# Patient Record
Sex: Female | Born: 1965 | Race: White | Hispanic: No | Marital: Single | Smoking: Current every day smoker
Health system: Southern US, Community
[De-identification: ages and names within clinical notes are randomized; demographics above are authoritative.]

## PROBLEM LIST (undated history)

## (undated) DIAGNOSIS — N289 Disorder of kidney and ureter, unspecified: Secondary | ICD-10-CM

## (undated) DIAGNOSIS — I10 Essential (primary) hypertension: Secondary | ICD-10-CM

## (undated) DIAGNOSIS — F4310 Post-traumatic stress disorder, unspecified: Secondary | ICD-10-CM

## (undated) DIAGNOSIS — R42 Dizziness and giddiness: Secondary | ICD-10-CM

---

## 2009-12-21 ENCOUNTER — Ambulatory Visit: Payer: Self-pay | Admitting: Diagnostic Radiology

## 2009-12-21 ENCOUNTER — Emergency Department (HOSPITAL_BASED_OUTPATIENT_CLINIC_OR_DEPARTMENT_OTHER): Admission: EM | Admit: 2009-12-21 | Discharge: 2009-12-21 | Payer: Self-pay | Admitting: Emergency Medicine

## 2010-06-04 LAB — GC/CHLAMYDIA PROBE AMP, GENITAL
Chlamydia, DNA Probe: NEGATIVE
GC Probe Amp, Genital: NEGATIVE

## 2010-06-04 LAB — DIFFERENTIAL
Basophils Absolute: 0.4 10*3/uL — ABNORMAL HIGH (ref 0.0–0.1)
Lymphs Abs: 1.1 10*3/uL (ref 0.7–4.0)
Monocytes Absolute: 1.3 10*3/uL — ABNORMAL HIGH (ref 0.1–1.0)
Monocytes Relative: 6 % (ref 3–12)
Neutrophils Relative %: 87 % — ABNORMAL HIGH (ref 43–77)

## 2010-06-04 LAB — BASIC METABOLIC PANEL
BUN: 16 mg/dL (ref 6–23)
Calcium: 10.6 mg/dL — ABNORMAL HIGH (ref 8.4–10.5)
Chloride: 95 mEq/L — ABNORMAL LOW (ref 96–112)
Creatinine, Ser: 1 mg/dL (ref 0.4–1.2)
GFR calc Af Amer: >60 mL/min (ref >60)

## 2010-06-04 LAB — CBC
MCV: 88.6 fL (ref 78.0–100.0)
Platelets: 289 10*3/uL (ref 150–400)
RBC: 4.98 MIL/uL (ref 3.87–5.11)
RDW: 12.7 % (ref 11.5–15.5)
WBC: 21.2 10*3/uL — ABNORMAL HIGH (ref 4.0–10.5)

## 2010-06-04 LAB — WET PREP, GENITAL
Trich, Wet Prep: NONE SEEN
Yeast Wet Prep HPF POC: NONE SEEN

## 2010-06-04 LAB — URINALYSIS, ROUTINE W REFLEX MICROSCOPIC
Ketones, ur: >80 mg/dL — AB
Nitrite: NEGATIVE
Urobilinogen, UA: 1 mg/dL (ref 0.0–1.0)

## 2010-06-04 LAB — URINE CULTURE

## 2010-06-04 LAB — PREGNANCY, URINE: Preg Test, Ur: NEGATIVE

## 2016-02-02 ENCOUNTER — Emergency Department (HOSPITAL_COMMUNITY)
Admission: EM | Admit: 2016-02-02 | Discharge: 2016-02-02 | Disposition: A | Discharge status: Home / Self Care | Payer: Self-pay | Attending: Emergency Medicine | Admitting: Emergency Medicine

## 2016-02-02 ENCOUNTER — Emergency Department (HOSPITAL_COMMUNITY): Payer: Self-pay

## 2016-02-02 ENCOUNTER — Encounter (HOSPITAL_COMMUNITY): Payer: Self-pay | Admitting: *Deleted

## 2016-02-02 DIAGNOSIS — Z760 Encounter for issue of repeat prescription: Secondary | ICD-10-CM | POA: Insufficient documentation

## 2016-02-02 DIAGNOSIS — R42 Dizziness and giddiness: Secondary | ICD-10-CM | POA: Insufficient documentation

## 2016-02-02 DIAGNOSIS — R002 Palpitations: Secondary | ICD-10-CM | POA: Insufficient documentation

## 2016-02-02 DIAGNOSIS — I10 Essential (primary) hypertension: Secondary | ICD-10-CM | POA: Insufficient documentation

## 2016-02-02 DIAGNOSIS — F17200 Nicotine dependence, unspecified, uncomplicated: Secondary | ICD-10-CM | POA: Insufficient documentation

## 2016-02-02 HISTORY — DX: Disorder of kidney and ureter, unspecified: N28.9

## 2016-02-02 HISTORY — DX: Dizziness and giddiness: R42

## 2016-02-02 HISTORY — DX: Essential (primary) hypertension: I10

## 2016-02-02 LAB — CBC
HEMATOCRIT: 43 % (ref 36.0–46.0)
HEMOGLOBIN: 15.2 g/dL — AB (ref 12.0–15.0)
MCH: 31.6 pg (ref 26.0–34.0)
MCHC: 35.3 g/dL (ref 30.0–36.0)
MCV: 89.4 fL (ref 78.0–100.0)
PLATELETS: 340 10*3/uL (ref 150–400)
RBC: 4.81 MIL/uL (ref 3.87–5.11)
RDW: 12.9 % (ref 11.5–15.5)
WBC: 9.4 10*3/uL (ref 4.0–10.5)

## 2016-02-02 LAB — URINALYSIS, ROUTINE W REFLEX MICROSCOPIC
BILIRUBIN URINE: NEGATIVE
Glucose, UA: NEGATIVE mg/dL
Hgb urine dipstick: NEGATIVE
Ketones, ur: NEGATIVE mg/dL
Leukocytes, UA: NEGATIVE
NITRITE: NEGATIVE
Protein, ur: NEGATIVE mg/dL
SPECIFIC GRAVITY, URINE: 1.007 (ref 1.005–1.030)
pH: 6 (ref 5.0–8.0)

## 2016-02-02 LAB — BASIC METABOLIC PANEL
Anion gap: 12 (ref 5–15)
BUN: 18 mg/dL (ref 6–20)
CHLORIDE: 104 mmol/L (ref 101–111)
CO2: 23 mmol/L (ref 22–32)
Calcium: 10 mg/dL (ref 8.9–10.3)
Creatinine, Ser: 0.86 mg/dL (ref 0.44–1.00)
GFR calc non Af Amer: >60 mL/min (ref >60)
Glucose, Bld: 100 mg/dL — ABNORMAL HIGH (ref 65–99)
POTASSIUM: 4.3 mmol/L (ref 3.5–5.1)
SODIUM: 139 mmol/L (ref 135–145)

## 2016-02-02 LAB — I-STAT TROPONIN, ED: Troponin i, poc: 0 ng/mL (ref 0.00–0.08)

## 2016-02-02 MED ORDER — ATENOLOL 25 MG PO TABS: 25.0000 mg | ORAL_TABLET | Freq: Every day | ORAL | 0 refills | Status: DC

## 2016-02-02 MED ORDER — MECLIZINE HCL 25 MG PO TABS
25.0000 mg | ORAL_TABLET | Freq: Once | ORAL | Status: AC
  Administered 2016-02-02: 25 mg via ORAL
  Filled 2016-02-02: qty 1

## 2016-02-02 MED ORDER — MECLIZINE HCL 25 MG PO TABS: 25.0000 mg | ORAL_TABLET | Freq: Two times a day (BID) | ORAL | 0 refills | Status: DC | PRN

## 2016-02-02 NOTE — ED Provider Notes (Signed)
[facility]-EMERGENCY DEPT Provider Note   CSN: [id number] Arrival date & time: 02/02/16  1129     History   Chief Complaint Chief Complaint  Patient presents with  . Chest Pain  . Medication Refill    HPI Jasmin Berry is a 50 y.o. female.  HPI Jasmin Berry is a 50 y.o. female with PMH significant for HTN, renal disorder, and vertigo who presents with 4 day history of palpitations with associated chest pain described as "heart pounding", shortness of breath, and states she can feel her heart beat in her ears and a hum throughout her whole body.  No syncope or near syncope.  She also states she has been having vertigo for the past several months.  She ran out of her BP medications last week and tried to follow up with her PCP, but the would not refill her atenolol prescription any longer.  She states since she ran out she has been taking her friend's unknown BP medications.  No head injury/trauma.  Patient's mood is labile during history and she sporadically begins crying and becoming very tearful and repetitively saying "i'm sorry, i'm sorry, i'm sorry" and will then speak about finding [employer] and she used to be [employer].  Per chart review, patient called her PCP today. She had missed her previous follow up appointment, and told her she needed to be seen in the office for her refill, but she declined.   Past Medical History:  Diagnosis Date  . Hypertension   . Renal disorder   . Vertigo     There are no active problems to display for this patient.   History reviewed. No pertinent surgical history.  OB History    No data available       Home Medications    Prior to Admission medications   Medication Sig Start Date End Date Taking? Authorizing Provider  atenolol (TENORMIN) 25 MG tablet Take 1 tablet (25 mg total) by mouth daily. 02/02/16   Cheri FowlerKayla Kynli Chou, PA-C  meclizine (ANTIVERT) 25 MG tablet Take 1 tablet (25 mg total) by mouth 2 (two) times daily as needed for dizziness.  02/02/16   Cheri FowlerKayla Nithya Meriweather, PA-C    Family History No family history on file.  Social History Social History  Substance Use Topics  . Smoking status: Current Every Day Smoker  . Smokeless tobacco: Never Used  . Alcohol use No     Allergies   Sulfa antibiotics   Review of Systems Review of Systems All other systems negative unless otherwise stated in HPI   Physical Exam Updated Vital Signs BP 118/91 (BP Location: Left Arm)   Pulse 98   Temp 98.1 F (36.7 C) (Oral)   Resp 16   Wt 43.1 kg   LMP  (LMP Unknown)   SpO2 100%   Physical Exam  Constitutional: She is oriented to person, place, and time. She appears well-developed and well-nourished.  Non-toxic appearance. She does not have a sickly appearance. She does not appear ill.  HENT:  Head: Normocephalic and atraumatic.  Mouth/Throat: Oropharynx is clear and moist.  Eyes: Conjunctivae are normal.  Neck: Normal range of motion. Neck supple.  Cardiovascular: Normal rate and regular rhythm.   Pulmonary/Chest: Effort normal and breath sounds normal. No accessory muscle usage or stridor. No respiratory distress. She has no wheezes. She has no rhonchi. She has no rales.  Abdominal: Soft. Bowel sounds are normal. She exhibits no distension. There is no tenderness.  Musculoskeletal: Normal range of motion.  Lymphadenopathy:    She has no cervical adenopathy.  Neurological: She is alert and oriented to person, place, and time.  Mental Status:   AOx3.  Speech clear without dysarthria. Cranial Nerves:  I-not tested  II-PERRLA  III, IV, VI-EOMs intact  V-temporal and masseter strength intact  VII-symmetrical facial movements intact, no facial droop  VIII-hearing grossly intact bilaterally  IX, X-gag intact  XI-strength of sternomastoid and trapezius muscles 5/5  XII-tongue midline Motor:   Good muscle bulk and tone  Strength 5/5 bilaterally in upper and lower extremities   Cerebellar--intact RAMs, finger to nose intact  bilaterally.  Gait normal  No pronator drift Sensory:  Intact in upper and lower extremities   Skin: Skin is warm and dry.  Psychiatric: She has a normal mood and affect. Her behavior is normal.     ED Treatments / Results  Labs (all labs ordered are listed, but only abnormal results are displayed) Labs Reviewed  BASIC METABOLIC PANEL - Abnormal; Notable for the following:       Result Value   Glucose, Bld 100 (*)    All other components within normal limits  CBC - Abnormal; Notable for the following:    Hemoglobin 15.2 (*)    All other components within normal limits  URINALYSIS, ROUTINE W REFLEX MICROSCOPIC (NOT AT [facility])  I-STAT TROPOININ, ED    EKG  EKG Interpretation None      ED ECG REPORT   Date: 02/02/2016  Rate: 119  Rhythm: sinus tachycardia with irregular rate  QRS Axis: normal  Intervals: normal  ST/T Wave abnormalities: borderline early repolarization  Conduction Disutrbances:none  Narrative Interpretation:   Old EKG Reviewed: none available  I have personally reviewed the EKG tracing and agree with the computerized printout as noted.   Radiology Dg Chest 2 View  Result Date: 02/02/2016 CLINICAL DATA:  Chest pain and nausea. EXAM: CHEST  2 VIEW COMPARISON:  None. FINDINGS: The cardiomediastinal silhouette is within normal limits. The lungs are well inflated and clear. There is no evidence of pleural effusion or pneumothorax. No acute osseous abnormality is identified. IMPRESSION: No active cardiopulmonary disease. Electronically Signed   By: Sebastian AcheAllen  Grady M.D.   On: 02/02/2016 12:31    Procedures Procedures (including critical care time)  Medications Ordered in ED Medications  meclizine (ANTIVERT) tablet 25 mg (25 mg Oral Given 02/02/16 1428)     Initial Impression / Assessment and Plan / ED Course  I have reviewed the triage vital signs and the nursing notes.  Pertinent labs & imaging results that were available during my care of the  patient were reviewed by me and considered in my medical decision making (see chart for details).  Clinical Course    Patient presents for medication refill.  She also presents with palpitations with chest pain x 4 days.  It has been constant.  She has been taking unknown blood pressure pills since she ran out.  Her mood is labile throughout H&P, laughing a joking and then suddenly tearful and crying.  She has a normal neurological exam.  Hx of vertigo and states this is not new.  She is concerned about her atenolol refill today.  Heart sounds normal.  Labs without acute abnormalities.  Single troponin sufficient given constant pain x 4 days.  Vitals and EKG reassuring.  Low suspicion for acute neurologic or cardiac cause at this time.  Will refill atenolol.  Urged follow up with PCP.  Warned patient of dangers and possible  death of taking unknown blood pressure medications. Return precautions discussed.  Stable for discharge.   Case has been discussed with Dr. Madilyn Hook who agrees with the above plan for discharge.   Final Clinical Impressions(s) / ED Diagnoses   Final diagnoses:  Palpitations  Medication refill  Vertigo    New Prescriptions Discharge Medication List as of 02/02/2016  2:43 PM    START taking these medications   Details  meclizine (ANTIVERT) 25 MG tablet Take 1 tablet (25 mg total) by mouth 2 (two) times daily as needed for dizziness., Starting Mon 02/02/2016, Print         Cheri Fowler, PA-C 02/02/16 2024    Tilden Fossa, MD 02/04/16 1534

## 2016-02-02 NOTE — Discharge Instructions (Signed)
Do not take unknown blood pressure medications as this can result in severe side effects and even death. Do not take her atenolol today. Start taking this tomorrow. [facility] for further medication refills. Return to the emergency department for severe chest pain, shortness of breath, passing out, or any new or concerning symptoms.

## 2016-02-02 NOTE — ED Notes (Signed)
Prior to medication administration pt request to void. Pt ambulated independently with stand by assist to nearby restroom.

## 2016-02-02 NOTE — ED Triage Notes (Signed)
Pt complains of chest pain, nausea since running out of her BP medication 1 week ago. Pt states [facility] and will no longer prescribe her BP medication for an unknown reason.. Pt states she ran out of BP medication. Pt states she has dizziness, can hear her heart beat in her ear. Pt states she tried calling her PCP. Pt takes atenolol.

## 2017-06-29 ENCOUNTER — Emergency Department (HOSPITAL_COMMUNITY)
Admission: EM | Admit: 2017-06-29 | Discharge: 2017-06-29 | Disposition: A | Discharge status: Home / Self Care | Payer: Self-pay | Attending: Emergency Medicine | Admitting: Emergency Medicine

## 2017-06-29 ENCOUNTER — Emergency Department (HOSPITAL_COMMUNITY): Payer: Self-pay

## 2017-06-29 ENCOUNTER — Encounter (HOSPITAL_COMMUNITY): Payer: Self-pay | Admitting: Emergency Medicine

## 2017-06-29 DIAGNOSIS — F17210 Nicotine dependence, cigarettes, uncomplicated: Secondary | ICD-10-CM | POA: Insufficient documentation

## 2017-06-29 DIAGNOSIS — Z79899 Other long term (current) drug therapy: Secondary | ICD-10-CM | POA: Insufficient documentation

## 2017-06-29 DIAGNOSIS — R51 Headache: Secondary | ICD-10-CM | POA: Insufficient documentation

## 2017-06-29 DIAGNOSIS — R202 Paresthesia of skin: Secondary | ICD-10-CM | POA: Insufficient documentation

## 2017-06-29 DIAGNOSIS — R42 Dizziness and giddiness: Secondary | ICD-10-CM | POA: Insufficient documentation

## 2017-06-29 DIAGNOSIS — R4781 Slurred speech: Secondary | ICD-10-CM | POA: Insufficient documentation

## 2017-06-29 DIAGNOSIS — I10 Essential (primary) hypertension: Secondary | ICD-10-CM | POA: Insufficient documentation

## 2017-06-29 DIAGNOSIS — R410 Disorientation, unspecified: Secondary | ICD-10-CM | POA: Insufficient documentation

## 2017-06-29 LAB — URINALYSIS, ROUTINE W REFLEX MICROSCOPIC
BACTERIA UA: NONE SEEN
BILIRUBIN URINE: NEGATIVE
GLUCOSE, UA: NEGATIVE mg/dL
HGB URINE DIPSTICK: NEGATIVE
KETONES UR: 5 mg/dL — AB
LEUKOCYTES UA: NEGATIVE
Nitrite: NEGATIVE
PROTEIN: 30 mg/dL — AB
Specific Gravity, Urine: 1.021 (ref 1.005–1.030)
pH: 6 (ref 5.0–8.0)

## 2017-06-29 LAB — BASIC METABOLIC PANEL
Anion gap: 11 (ref 5–15)
BUN: 22 mg/dL — ABNORMAL HIGH (ref 6–20)
CHLORIDE: 102 mmol/L (ref 101–111)
CO2: 27 mmol/L (ref 22–32)
CREATININE: 0.99 mg/dL (ref 0.44–1.00)
Calcium: 9.8 mg/dL (ref 8.9–10.3)
GFR calc non Af Amer: >60 mL/min (ref >60)
Glucose, Bld: 156 mg/dL — ABNORMAL HIGH (ref 65–99)
POTASSIUM: 4.2 mmol/L (ref 3.5–5.1)
SODIUM: 140 mmol/L (ref 135–145)

## 2017-06-29 LAB — CBC
HEMATOCRIT: 45.3 % (ref 36.0–46.0)
Hemoglobin: 15.3 g/dL — ABNORMAL HIGH (ref 12.0–15.0)
MCH: 31.2 pg (ref 26.0–34.0)
MCHC: 33.8 g/dL (ref 30.0–36.0)
MCV: 92.4 fL (ref 78.0–100.0)
PLATELETS: 367 10*3/uL (ref 150–400)
RBC: 4.9 MIL/uL (ref 3.87–5.11)
RDW: 13 % (ref 11.5–15.5)
WBC: 11.6 10*3/uL — AB (ref 4.0–10.5)

## 2017-06-29 LAB — I-STAT BETA HCG BLOOD, ED (MC, WL, AP ONLY)

## 2017-06-29 MED ORDER — MECLIZINE HCL 25 MG PO TABS: 25.0000 mg | ORAL_TABLET | Freq: Three times a day (TID) | ORAL | 0 refills | Status: DC | PRN

## 2017-06-29 MED ORDER — ATENOLOL 25 MG PO TABS: 25.0000 mg | ORAL_TABLET | Freq: Every day | ORAL | 0 refills | Status: DC

## 2017-06-29 MED ORDER — MECLIZINE HCL 25 MG PO TABS
25.0000 mg | ORAL_TABLET | Freq: Once | ORAL | Status: AC
  Administered 2017-06-29: 25 mg via ORAL
  Filled 2017-06-29: qty 1

## 2017-06-29 MED ORDER — SODIUM CHLORIDE 0.9 % IV BOLUS: 1000.0000 mL | Freq: Once | INTRAVENOUS | Status: DC

## 2017-06-29 NOTE — ED Notes (Signed)
Patient states heavy PTSD and Anxiety history. Attempted IV but patient had a panic attack. Patient refusing bolus and IV. EDPA made aware.

## 2017-06-29 NOTE — ED Provider Notes (Signed)
[facility]-EMERGENCY DEPT Provider Note   CSN: [id number] Arrival date & time: 06/29/17  1724     History   Chief Complaint Chief Complaint  Patient presents with  . Dizziness  . Headache    HPI Jasmin Berry is a 52 y.o. female who presents with dizziness.  Past medical history significant for hypertension, renal disorder, vertigo.  She states she has had vertigo for years however in the past 2 months it has been more intense in nature.  She states she has not told anyone because she did not want to bring attention to it.  She has taken meclizine in the past which provided temporary relief however she cannot afford this medication.  She has also been off of her blood pressure medicine because she does not have insurance and cannot follow-up with a primary doctor.  Today she went about her usual business and went to a friend's apartment.  She had a sudden onset of confusion, slurred speech, tingling of her left forearm and left leg.  She felt like there was a "penny, metal, and peppermint taste" in her mouth.  She denies loss of consciousness during this episode.  The episode lasted approximately 15 minutes.  She decided to come to the emergency department to get checked out.  HPI  Past Medical History:  Diagnosis Date  . Hypertension   . Renal disorder   . Vertigo     There are no active problems to display for this patient.   History reviewed. No pertinent surgical history.   OB History   None      Home Medications    Prior to Admission medications   Medication Sig Start Date End Date Taking? Authorizing Provider  atenolol (TENORMIN) 25 MG tablet Take 1 tablet (25 mg total) by mouth daily. 02/02/16   Cheri Fowler, PA-C  meclizine (ANTIVERT) 25 MG tablet Take 1 tablet (25 mg total) by mouth 2 (two) times daily as needed for dizziness. 02/02/16   Cheri Fowler, PA-C    Family History No family history on file.  Social History Social History    Tobacco Use  . Smoking status: Current Every Day Smoker    Types: Cigarettes  . Smokeless tobacco: Never Used  Substance Use Topics  . Alcohol use: No  . Drug use: Not on file     Allergies   Sulfa antibiotics and Yeast-related products   Review of Systems Review of Systems  Constitutional: Negative for fever.  Respiratory: Negative for shortness of breath.   Cardiovascular: Positive for palpitations. Negative for chest pain.  Gastrointestinal: Negative for abdominal pain, nausea and vomiting.  Genitourinary: Negative for dysuria.  Neurological: Positive for dizziness and headaches. Negative for syncope and weakness.     Physical Exam Updated Vital Signs BP (!) 144/95 (BP Location: Left Arm)   Pulse (!) 120   Temp 99 F (37.2 C) (Oral)   Resp 19   SpO2 99%   Physical Exam  Constitutional: She is oriented to person, place, and time. She appears well-developed and well-nourished. No distress.  Cooperative, bright affect  HENT:  Head: Normocephalic and atraumatic.  Eyes: Pupils are equal, round, and reactive to light. Conjunctivae are normal. Right eye exhibits no discharge. Left eye exhibits no discharge. No scleral icterus.  Neck: Normal range of motion.  Cardiovascular: Normal rate and regular rhythm.  Pulmonary/Chest: Effort normal and breath sounds normal. No respiratory distress.  Abdominal: Soft. Bowel sounds are normal. She exhibits no distension. There  is no tenderness.  Neurological: She is alert and oriented to person, place, and time.  Mental Status:  Alert, oriented, thought content appropriate, able to give a coherent history. Speech fluent without evidence of aphasia. Able to follow 2 step commands without difficulty.  Cranial Nerves:  II:  Peripheral visual fields grossly normal, pupils equal, round, reactive to light III,IV, VI: ptosis not present, extra-ocular motions intact bilaterally. Reported dizziness with EOM V,VII: smile symmetric, facial  light touch sensation equal VIII: hearing grossly normal to voice  X: uvula elevates symmetrically  XI: bilateral shoulder shrug symmetric and strong XII: midline tongue extension without fassiculations Motor:  Normal tone. 5/5 in upper and lower extremities bilaterally including strong and equal grip strength and dorsiflexion/plantar flexion Sensory: Pinprick and light touch normal in all extremities.  Cerebellar: normal finger-to-nose with bilateral upper extremities Gait: normal gait and balance CV: distal pulses palpable throughout    Skin: Skin is warm and dry.  Psychiatric: She has a normal mood and affect. Her behavior is normal.  Nursing note and vitals reviewed.    ED Treatments / Results  Labs (all labs ordered are listed, but only abnormal results are displayed) Labs Reviewed  BASIC METABOLIC PANEL - Abnormal; Notable for the following components:      Result Value   Glucose, Bld 156 (*)    BUN 22 (*)    All other components within normal limits  CBC - Abnormal; Notable for the following components:   WBC 11.6 (*)    Hemoglobin 15.3 (*)    All other components within normal limits  URINALYSIS, ROUTINE W REFLEX MICROSCOPIC - Abnormal; Notable for the following components:   APPearance HAZY (*)    Ketones, ur 5 (*)    Protein, ur 30 (*)    Squamous Epithelial / LPF 0-5 (*)    All other components within normal limits  CBG MONITORING, ED  I-STAT BETA HCG BLOOD, ED ([facility], [facility], [facility] ONLY)    EKG EKG Interpretation  Date/Time:  Wednesday June 29 2017 17:39:56 EDT Ventricular Rate:  110 PR Interval:    QRS Duration: 66 QT Interval:  326 QTC Calculation: 441 R Axis:   80 Text Interpretation:  Sinus tachycardia Biatrial enlargement No significant change since last tracing Confirmed by Linwood DibblesKnapp, Jon [phone]) on 06/29/2017 5:48:01 PM   Radiology Ct Head Wo Contrast  Result Date: 06/29/2017 CLINICAL DATA:  Headache with disorientation. EXAM: CT HEAD WITHOUT CONTRAST  TECHNIQUE: Contiguous axial images were obtained from the base of the skull through the vertex without intravenous contrast. COMPARISON:  None. FINDINGS: BRAIN: The ventricles and sulci are normal. No intraparenchymal hemorrhage, mass effect nor midline shift. No acute large vascular territory infarcts. Grey-white matter distinction is maintained. The basal ganglia are unremarkable. No abnormal extra-axial fluid collections. Basal cisterns are not effaced and midline. The brainstem and cerebellar hemispheres are without acute abnormalities. VASCULAR: Mild atherosclerosis of the carotid siphons. SKULL/SOFT TISSUES: No skull fracture. No significant soft tissue swelling. ORBITS/SINUSES: The included ocular globes and orbital contents are normal.The mastoid air cells are clear. The included paranasal sinuses are well-aerated. OTHER: None. IMPRESSION: Normal head CT Electronically Signed   By: Tollie Ethavid  Kwon M.D.   On: 06/29/2017 19:21    Procedures Procedures (including critical care time)  Medications Ordered in ED Medications - No data to display   Initial Impression / Assessment and Plan / ED Course  I have reviewed the triage vital signs and the nursing notes.  Pertinent labs &  imaging results that were available during my care of the patient were reviewed by me and considered in my medical decision making (see chart for details).  52 year old female presents with intermittent dizziness and episode of confusion and headache earlier today.  In triage she is tachycardic and hypertensive.  This is improved after recheck. Her neurologic exam is unremarkable.  She reports dizziness with head movements and eye movements.  Will obtain labs and CT head.  Will give dose of meclizine.  CBC is remarkable for mild leukocytosis of 11.6.  BMP is remarkable for mild hyperglycemia.  Her urine has 5 ketones and 30 protein.  Fluid was ordered however she could not tolerate an IV.  She was encouraged to orally hydrate.   CT of the head was negative.  All results were discussed with the patient.  She was given a prescription for meclizine and and atenolol and coupons so she can afford these medicines.  She was also given [place] [facility] follow-up.  Final Clinical Impressions(s) / ED Diagnoses   Final diagnoses:  Dizziness    ED Discharge Orders    None       Beryle Quant 06/29/17 2129    Linwood Dibbles, MD 06/29/17 2348

## 2017-06-29 NOTE — Discharge Instructions (Signed)
Take Meclizine up to three times daily for dizziness Take Atenolol daily [facility] Return if worsening

## 2017-06-29 NOTE — ED Notes (Signed)
EDPA Provider at bedside. 

## 2017-06-29 NOTE — ED Triage Notes (Signed)
Pt reports that she was at her friends house and all of sudden got disoriented and headache, felt clammy, dry mouth. Reports that she hasnt had her HTN medications in years and only has one kidney.

## 2017-08-23 ENCOUNTER — Emergency Department (HOSPITAL_COMMUNITY)
Admission: EM | Admit: 2017-08-23 | Discharge: 2017-08-23 | Disposition: A | Discharge status: Home / Self Care | Payer: Self-pay | Attending: Emergency Medicine | Admitting: Emergency Medicine

## 2017-08-23 ENCOUNTER — Encounter (HOSPITAL_COMMUNITY): Payer: Self-pay | Admitting: Emergency Medicine

## 2017-08-23 ENCOUNTER — Other Ambulatory Visit: Payer: Self-pay

## 2017-08-23 DIAGNOSIS — F17210 Nicotine dependence, cigarettes, uncomplicated: Secondary | ICD-10-CM | POA: Insufficient documentation

## 2017-08-23 DIAGNOSIS — R002 Palpitations: Secondary | ICD-10-CM | POA: Insufficient documentation

## 2017-08-23 DIAGNOSIS — Z79899 Other long term (current) drug therapy: Secondary | ICD-10-CM | POA: Insufficient documentation

## 2017-08-23 DIAGNOSIS — I10 Essential (primary) hypertension: Secondary | ICD-10-CM | POA: Insufficient documentation

## 2017-08-23 DIAGNOSIS — Z3202 Encounter for pregnancy test, result negative: Secondary | ICD-10-CM | POA: Insufficient documentation

## 2017-08-23 DIAGNOSIS — R42 Dizziness and giddiness: Secondary | ICD-10-CM | POA: Insufficient documentation

## 2017-08-23 LAB — CBC
HEMATOCRIT: 41.8 % (ref 36.0–46.0)
Hemoglobin: 14.3 g/dL (ref 12.0–15.0)
MCH: 31.2 pg (ref 26.0–34.0)
MCHC: 34.2 g/dL (ref 30.0–36.0)
MCV: 91.3 fL (ref 78.0–100.0)
Platelets: 298 10*3/uL (ref 150–400)
RBC: 4.58 MIL/uL (ref 3.87–5.11)
RDW: 12.6 % (ref 11.5–15.5)
WBC: 7 10*3/uL (ref 4.0–10.5)

## 2017-08-23 LAB — BASIC METABOLIC PANEL
ANION GAP: 9 (ref 5–15)
BUN: 22 mg/dL — AB (ref 6–20)
CHLORIDE: 104 mmol/L (ref 101–111)
CO2: 27 mmol/L (ref 22–32)
Calcium: 9.4 mg/dL (ref 8.9–10.3)
Creatinine, Ser: 0.74 mg/dL (ref 0.44–1.00)
Glucose, Bld: 99 mg/dL (ref 65–99)
Potassium: 4 mmol/L (ref 3.5–5.1)
SODIUM: 140 mmol/L (ref 135–145)

## 2017-08-23 LAB — URINALYSIS, ROUTINE W REFLEX MICROSCOPIC
Bilirubin Urine: NEGATIVE
Glucose, UA: NEGATIVE mg/dL
Hgb urine dipstick: NEGATIVE
KETONES UR: NEGATIVE mg/dL
LEUKOCYTES UA: NEGATIVE
NITRITE: NEGATIVE
PROTEIN: NEGATIVE mg/dL
Specific Gravity, Urine: 1.01 (ref 1.005–1.030)
pH: 6 (ref 5.0–8.0)

## 2017-08-23 LAB — POC URINE PREG, ED: PREG TEST UR: NEGATIVE

## 2017-08-23 LAB — CBG MONITORING, ED: Glucose-Capillary: 88 mg/dL (ref 65–99)

## 2017-08-23 MED ORDER — ATENOLOL 25 MG PO TABS: 25.0000 mg | ORAL_TABLET | Freq: Every day | ORAL | 0 refills | Status: DC

## 2017-08-23 MED ORDER — CETIRIZINE HCL 10 MG PO TABS: 10.0000 mg | ORAL_TABLET | Freq: Every day | ORAL | 0 refills | Status: DC

## 2017-08-23 MED ORDER — FLUTICASONE PROPIONATE 50 MCG/ACT NA SUSP: 1.0000 | Freq: Every day | NASAL | 2 refills | Status: DC

## 2017-08-23 MED ORDER — NAPROXEN 375 MG PO TABS: 375.0000 mg | ORAL_TABLET | Freq: Two times a day (BID) | ORAL | 0 refills | Status: DC

## 2017-08-23 NOTE — ED Triage Notes (Signed)
Pt verbalizes palpitations with running out of blood pressure medication yesterday. Pt continues to verbalizes continued dizziness post diagnosis of vertigo and "inner ear and sinuses."

## 2017-08-23 NOTE — Discharge Instructions (Signed)
Please see the information and instructions below regarding your visit.  Your diagnoses today include:  1. Palpitations   2. Dizziness    Your work-up is very reassuring today.  Your blood counts and kidney function are normal today.  Tests performed today include: See side panel of your discharge paperwork for testing performed today. Vital signs are listed at the bottom of these instructions.   Your EKG shows that you have a premature beat, but no other abnormalities.  Medications prescribed:    Take any prescribed medications only as prescribed, and any over the counter medications only as directed on the packaging.  Please initiate therapy for allergic rhinitis with Zyrtec and Flonase.  You will spray Flonase 1 spray in each nostril once daily in the morning.  Home care instructions:  Please follow any educational materials contained in this packet.   For the jaw joint, please place warm compresses on these joints multiple times a day when you are having temple pain.  Follow-up instructions: Please follow-up with your primary care provider listed in this paperwork for further evaluation of your symptoms if they are not completely improved.   Return instructions:  Please return to the Emergency Department if you experience worsening symptoms.  Please return to the emergency department if you develop any chest pain, shortness of breath, weakness or numbness in extremities, or sudden passing out spells.  Please return if you have any other emergent concerns.  Additional Information:   Your vital signs today were: BP (!) 119/100 (BP Location: Left Arm)    Pulse (!) 102    Temp 98.3 F (36.8 C) (Oral)    Resp 16    SpO2 100%  If your blood pressure (BP) was elevated on multiple readings during this visit above 130 for the top number or above 80 for the bottom number, please have this repeated by your primary care provider within one month. --------------  Thank you for  allowing us to participate in your care today.

## 2017-08-23 NOTE — Care Management Note (Signed)
Case Management Note  CM consulted for no pcp and no ins with pt's desire to establish a pcp.  Information placed on AVS for [facility], financial counseling, and pharmacy.  Elnoria HowardUpdated Murray, PA.  No further CM needs noted at this time.

## 2017-08-23 NOTE — ED Provider Notes (Signed)
[facility]-EMERGENCY DEPT Provider Note   CSN: [id number] Arrival date & time: 08/23/17  1329     History   Chief Complaint Chief Complaint  Patient presents with  . Palpitations  . Dizziness    HPI Jasmin Berry is a 52 y.o. female.  HPI  Patient is a 52 year old female with a history of hypertension, scarring of the left kidney, and dizziness previously diagnosis vertigo presenting for palpitations, recurrent dizziness.  Patient reports that all of the symptoms she is experienced for "years", however she ran out of her atenolol yesterday, and she reports that this typically calls her symptoms.  Patient reports that her "dizzy spells" her previous diagnosis vertigo, and she has sensation of the room is spinning, particularly with certain eye or head movements.  Patient describes this for 5 years.  Patient reports she does not experience tinnitus, but does note that she feels a "popping" in her ears particularly with opening her mouth widely.  Patient also reports that she will experience some nausea without vomiting when she has a severe episode of vertigo.  Patient denies any fevers, chills, recent respiratory illnesses, visual disturbance, weakness or numbness in extremities, difficulty walking, chest pain, shortness of breath.   Past Medical History:  Diagnosis Date  . Hypertension   . Renal disorder   . Vertigo     There are no active problems to display for this patient.   History reviewed. No pertinent surgical history.   OB History   None      Home Medications    Prior to Admission medications   Medication Sig Start Date End Date Taking? Authorizing Provider  acetaminophen (TYLENOL) 500 MG tablet Take 1,000 mg by mouth every 6 (six) hours as needed.   Yes [provider]  atenolol (TENORMIN) 25 MG tablet Take 1 tablet (25 mg total) by mouth daily. 06/29/17  Yes Bethel BornGekas, Kelly Marie, PA-C  meclizine (ANTIVERT) 25 MG tablet Take 1 tablet  (25 mg total) by mouth 3 (three) times daily as needed for dizziness. 06/29/17  Yes Bethel BornGekas, Kelly Marie, PA-C  OVER THE COUNTER MEDICATION Take by mouth daily as needed. CBD Oil -use 1 dropperful orally repeatedly daily, patient says "a lot"   Yes [provider]  MAGNESIUM PO Take by mouth.    [provider]  Omega-3 Fatty Acids (FISH OIL PO) Take by mouth.    [provider]    Family History No family history on file.  Social History Social History   Tobacco Use  . Smoking status: Current Every Day Smoker    Types: Cigarettes  . Smokeless tobacco: Never Used  Substance Use Topics  . Alcohol use: No  . Drug use: Not on file     Allergies   Sulfa antibiotics and Yeast-related products   Review of Systems Review of Systems  Constitutional: Negative for chills and fever.  HENT: Negative for congestion and rhinorrhea.   Respiratory: Negative for cough, chest tightness and shortness of breath.   Cardiovascular: Negative for chest pain.  Gastrointestinal: Positive for nausea. Negative for abdominal pain and vomiting.  Musculoskeletal: Negative for arthralgias and myalgias.  Skin: Negative for rash.  Neurological: Positive for dizziness and light-headedness. Negative for speech difficulty, weakness and numbness.  All other systems reviewed and are negative.    Physical Exam Updated Vital Signs BP (!) 119/100 (BP Location: Left Arm)   Pulse (!) 102   Temp 98.3 F (36.8 C) (Oral)   Resp 16  SpO2 100%   Physical Exam  Constitutional: She appears well-developed and well-nourished. No distress.  HENT:  Head: Normocephalic and atraumatic.  Mouth/Throat: Oropharynx is clear and moist.  Bilateral TMs pearly gray appearance with good visualization of bony limits.   No effusions.  Discomfort to palpation of bilateral TMJs. Facet wear of teeth.  Eyes: Pupils are equal, round, and reactive to light. Conjunctivae and EOM are normal.  Neck: Normal  range of motion. Neck supple.  Cardiovascular: Normal rate, regular rhythm, S1 normal and S2 normal.  No murmur heard. Pulmonary/Chest: Effort normal and breath sounds normal. She has no wheezes. She has no rales.  Abdominal: Soft. She exhibits no distension.  Musculoskeletal: Normal range of motion. She exhibits no edema or deformity.  Neurological: She is alert.  Cranial nerves grossly intact. Patient moves extremities symmetrically and with good coordination.  Skin: Skin is warm and dry. No rash noted. No erythema.  Psychiatric: She has a normal mood and affect. Her behavior is normal. Judgment and thought content normal.  Nursing note and vitals reviewed.    ED Treatments / Results  Labs (all labs ordered are listed, but only abnormal results are displayed) Labs Reviewed  BASIC METABOLIC PANEL - Abnormal; Notable for the following components:      Result Value   BUN 22 (*)    All other components within normal limits  CBC  URINALYSIS, ROUTINE W REFLEX MICROSCOPIC  CBG MONITORING, ED  POC URINE PREG, ED    EKG EKG Interpretation  Date/Time:  Tuesday August 23 2017 13:45:03 EDT Ventricular Rate:  106 PR Interval:    QRS Duration: 57 QT Interval:  355 QTC Calculation: 472 R Axis:   82 Text Interpretation:  Sinus tachycardia Ventricular premature complex Right atrial enlargement Baseline wander in lead(s) V1 No significant change was found Confirmed by Azalia Bilis ([id number]) on 08/23/2017 2:48:53 PM   Radiology No results found.  Procedures Procedures (including critical care time)  Medications Ordered in ED Medications - No data to display   Initial Impression / Assessment and Plan / ED Course  I have reviewed the triage vital signs and the nursing notes.  Pertinent labs & imaging results that were available during my care of the patient were reviewed by me and considered in my medical decision making (see chart for details).  Clinical Course as of Aug 24 1542    Tue Aug 23, 2017  1537 Patient not tachycardic on my examination.   [AM]    Clinical Course User Index [AM] Elisha Ponder, PA-C    Patient nontoxic-appearing and in no acute distress.  EKG shows premature ventricular complex, but no other ectopy, arrhythmia, or signs of ischemia.  Lab work entirely normal today.  Patient not describing any acute symptoms today, but reports that she is unable to get into primary care provider.  Patient has had previous head imaging and a presentation of dizziness associated with other neurologic symptoms.  Do not feel that it would need to be repeated today, as patient has no other new neurologic symptoms.  Differential diagnosis for patient's symptoms in the head neck include benign positional vertigo, eustachian tube dysfunction, TMJ disorder.  We will treat patient with allergic rhinitis therapies, and give information for TMJ therapies, and refill Atenolol.  Case measurement consultation placed to assist patient getting a primary care as well as resources dispensed.  Patient return precautions for any chest pain, shortness of breath, syncope or presyncope with palpitations, or new worsening neurologic  symptoms.  Patient is in understanding and agrees the plan of care.  Final Clinical Impressions(s) / ED Diagnoses   Final diagnoses:  Palpitations  Dizziness    ED Discharge Orders        Ordered    atenolol (TENORMIN) 25 MG tablet  Daily     08/23/17 1553    cetirizine (ZYRTEC) 10 MG tablet  Daily     08/23/17 1553    fluticasone (FLONASE) 50 MCG/ACT nasal spray  Daily     08/23/17 1553    naproxen (NAPROSYN) 375 MG tablet  2 times daily     08/23/17 1553       Elisha Ponder, PA-C 08/23/17 1607    Pricilla Loveless, MD 08/24/17 [phone]

## 2018-01-14 ENCOUNTER — Emergency Department (HOSPITAL_COMMUNITY)
Admission: EM | Admit: 2018-01-14 | Discharge: 2018-01-14 | Disposition: A | Discharge status: Home / Self Care | Payer: Self-pay | Attending: Emergency Medicine | Admitting: Emergency Medicine

## 2018-01-14 ENCOUNTER — Other Ambulatory Visit: Payer: Self-pay

## 2018-01-14 ENCOUNTER — Encounter (HOSPITAL_COMMUNITY): Payer: Self-pay | Admitting: Emergency Medicine

## 2018-01-14 DIAGNOSIS — F419 Anxiety disorder, unspecified: Secondary | ICD-10-CM | POA: Insufficient documentation

## 2018-01-14 DIAGNOSIS — Z046 Encounter for general psychiatric examination, requested by authority: Secondary | ICD-10-CM | POA: Insufficient documentation

## 2018-01-14 DIAGNOSIS — F17210 Nicotine dependence, cigarettes, uncomplicated: Secondary | ICD-10-CM | POA: Insufficient documentation

## 2018-01-14 DIAGNOSIS — R21 Rash and other nonspecific skin eruption: Secondary | ICD-10-CM | POA: Insufficient documentation

## 2018-01-14 DIAGNOSIS — I10 Essential (primary) hypertension: Secondary | ICD-10-CM | POA: Insufficient documentation

## 2018-01-14 DIAGNOSIS — R451 Restlessness and agitation: Secondary | ICD-10-CM | POA: Insufficient documentation

## 2018-01-14 DIAGNOSIS — R490 Dysphonia: Secondary | ICD-10-CM | POA: Insufficient documentation

## 2018-01-14 DIAGNOSIS — Z79899 Other long term (current) drug therapy: Secondary | ICD-10-CM | POA: Insufficient documentation

## 2018-01-14 DIAGNOSIS — F329 Major depressive disorder, single episode, unspecified: Secondary | ICD-10-CM | POA: Insufficient documentation

## 2018-01-14 LAB — RAPID URINE DRUG SCREEN, HOSP PERFORMED
Amphetamines: NOT DETECTED
Barbiturates: NOT DETECTED
Benzodiazepines: NOT DETECTED
COCAINE: NOT DETECTED
OPIATES: NOT DETECTED
Tetrahydrocannabinol: POSITIVE — AB

## 2018-01-14 MED ORDER — TRIAMCINOLONE ACETONIDE 0.1 % EX CREA: 1.0000 "application " | TOPICAL_CREAM | Freq: Two times a day (BID) | CUTANEOUS | 0 refills | Status: DC

## 2018-01-14 MED ORDER — CEPHALEXIN 250 MG PO CAPS: 250.0000 mg | ORAL_CAPSULE | Freq: Two times a day (BID) | ORAL | 0 refills | Status: AC

## 2018-01-14 NOTE — Discharge Instructions (Signed)
Return to ED for worsening symptoms, lip swelling, trouble breathing or trouble swallowing, chest pain or shortness of breath.

## 2018-01-14 NOTE — ED Notes (Signed)
Pt requests to have her throat checked, due to hoarseness.

## 2018-01-14 NOTE — ED Provider Notes (Signed)
[facility]-EMERGENCY DEPT Provider Note   CSN: [id number] Arrival date & time: 01/14/18  0546     History   Chief Complaint Chief Complaint  Patient presents with  . Rash    HPI Jasmin Berry is a 52 y.o. female with past medical history of hypertension, vertigo who presents to ED for multiple complaints.  Her first complaint is a rash that she has had on her neck and her back intermittently for the past 3 months.  States that her rash flares up anytime she is stressed.  Patient appears to be a poor historian, has tangential speech.  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  She also states that she has had a flareup of her "yeast infection" on her skin.  States that "I cannot even eat yeast rolls without it flaring up." Denies any suicidal ideations but states that she did have a "mental breakdown" several weeks ago in which she vaguely states that she had "a bottle of laxatives, some other medicine and Clorox that I put under my bathroom sink."  Again, patient is difficult to understand.  States that she is a "suicide survivor" and that the suicide helpline has not been helping her.  She denies any homicidal ideations.  She does feel like there are bugs crawling in her rash.  Denies any auditory or visual hallucinations.  HPI  Past Medical History:  Diagnosis Date  . Hypertension   . Renal disorder   . Vertigo     There are no active problems to display for this patient.   History reviewed. No pertinent surgical history.   OB History   None      Home Medications    Prior to Admission medications   Medication Sig Start Date End Date Taking? Authorizing Provider  MAGNESIUM PO Take by mouth.   Yes [provider]  OVER THE COUNTER MEDICATION Take by mouth daily as needed. CBD Oil -use 1 dropperful orally repeatedly daily, patient says "a lot"   Yes [provider]  atenolol (TENORMIN) 25 MG tablet Take 1 tablet (25 mg total) by mouth daily. 08/23/17   Aviva Kluver B, PA-C  cephALEXin (KEFLEX) 250 MG capsule Take 1 capsule (250 mg total) by mouth 2 (two) times daily for 7 days. 01/14/18 01/21/18  Dearius Hoffmann, PA-C  cetirizine (ZYRTEC) 10 MG tablet Take 1 tablet (10 mg total) by mouth daily. 08/23/17 09/22/17  Aviva Kluver B, PA-C  fluticasone (FLONASE) 50 MCG/ACT nasal spray Place 1 spray into both nostrils daily. 08/23/17   Aviva Kluver B, PA-C  meclizine (ANTIVERT) 25 MG tablet Take 1 tablet (25 mg total) by mouth 3 (three) times daily as needed for dizziness. 06/29/17   Bethel Born, PA-C  naproxen (NAPROSYN) 375 MG tablet Take 1 tablet (375 mg total) by mouth 2 (two) times daily. 08/23/17   Aviva Kluver B, PA-C  triamcinolone cream (KENALOG) 0.1 % Apply 1 application topically 2 (two) times daily. 01/14/18   Dietrich Pates, PA-C    Family History No family history on file.  Social History Social History   Tobacco Use  . Smoking status: Current Every Day Smoker    Types: Cigarettes  . Smokeless tobacco: Never  Used  Substance Use Topics  . Alcohol use: No  . Drug use: Not on file     Allergies   Sulfa antibiotics and Yeast-related products   Review of Systems Review of Systems  Constitutional: Negative for appetite change, chills and fever.  HENT: Negative for ear pain, rhinorrhea, sneezing and sore throat.   Eyes: Negative for photophobia and visual disturbance.  Respiratory: Negative for cough, chest tightness, shortness of breath and wheezing.   Cardiovascular: Negative for chest pain and palpitations.  Gastrointestinal: Negative for abdominal pain, blood in stool, constipation, diarrhea, nausea and vomiting.  Genitourinary: Negative for dysuria, hematuria  and urgency.  Musculoskeletal: Negative for myalgias.  Skin: Positive for rash.  Neurological: Negative for dizziness, weakness and light-headedness.  Psychiatric/Behavioral: Positive for agitation. The patient is hyperactive.      Physical Exam Updated Vital Signs BP (!) 148/88   Pulse 90   Temp 97.9 F (36.6 C) (Oral)   Resp 20   SpO2 100%   Physical Exam  Constitutional: She appears well-developed and well-nourished. No distress.  HENT:  Head: Normocephalic and atraumatic.  Nose: Nose normal.  Eyes: Conjunctivae and EOM are normal. Left eye exhibits no discharge. No scleral icterus.  Neck: Normal range of motion. Neck supple.  Cardiovascular: Normal rate, regular rhythm, normal heart sounds and intact distal pulses. Exam reveals no gallop and no friction rub.  No murmur heard. Pulmonary/Chest: Effort normal and breath sounds normal. No respiratory distress.  Abdominal: Soft. Bowel sounds are normal. She exhibits no distension. There is no tenderness. There is no guarding.  Musculoskeletal: Normal range of motion. She exhibits no edema.  Neurological: She is alert. She exhibits normal muscle tone. Coordination normal.  Skin: Skin is warm and dry. Rash noted.  Several erythematous, scabbed lesions noted to posterior neck, satellite lesions going down back.  Psychiatric: Her mood appears anxious. Her speech is tangential. She is agitated. She exhibits a depressed mood.  Nursing note and vitals reviewed.    ED Treatments / Results  Labs (all labs ordered are listed, but only abnormal results are displayed) Labs Reviewed  RAPID URINE DRUG SCREEN, HOSP PERFORMED - Abnormal; Notable for the following components:      Result Value   Tetrahydrocannabinol POSITIVE (*)    All other components within normal limits    EKG None  Radiology No results found.  Procedures Procedures (including critical care time)  Medications Ordered in ED Medications - No data to  display   Initial Impression / Assessment and Plan / ED Course  I have reviewed the triage vital signs and the nursing notes.  Pertinent labs & imaging results that were available during my care of the patient were reviewed by me and considered in my medical decision making (see chart for details).     52 year old female presents to ED for rash.  States that she has had intermittent rash similar to this in the past several months secondary to stress.  States that she is stressed from her job and home life.  Patient with tangential speech, many emotions during my encounter.  She denies any suicidal or homicidal ideations however, she did want to speak to a behavioral health specialist. Pt has a patent airway without stridor and is handling secretions without difficulty; no angioedema. No blisters, no pustules, no warmth, no draining sinus tracts, no superficial abscesses, no bullous impetigo, no vesicles, no desquamation, no target lesions with dusky purpura or a central bulla. Not tender to touch. No  concern for superimposed infection. No concern for SJS, TEN, TSS, tick borne illness, syphilis or other life-threatening condition.  Suspect that her symptoms are due to her stress.  States that Keflex and triamcinolone have helped in the past.  Patient is psychiatrically cleared by TTS.  Will advise her to return to ED for any severe worsening symptoms.  Portions of this note were generated with Scientist, clinical (histocompatibility and immunogenetics). Dictation errors may occur despite best attempts at proofreading.  Final Clinical Impressions(s) / ED Diagnoses   Final diagnoses:  Rash and nonspecific skin eruption    ED Discharge Orders         Ordered    triamcinolone cream (KENALOG) 0.1 %  2 times daily     01/14/18 0958    cephALEXin (KEFLEX) 250 MG capsule  2 times daily     01/14/18 0958           Dietrich Pates, PA-C 01/14/18 7846    Doug Sou, MD 01/14/18 [phone]

## 2018-01-14 NOTE — ED Triage Notes (Addendum)
Pt comes in complaining of wounds that have popped up on her neck and back. Patient states she thinks they are stress related.  Spots are scabbed and red. Patient states steroid cream worked last time but she is out of it. Patient is stressed, anxious and angry at her visitor and states that is why the spots have popped up.

## 2018-01-14 NOTE — ED Notes (Signed)
Bed: WA05 Expected date:  Expected time:  Means of arrival:  Comments: 

## 2018-01-14 NOTE — BH Assessment (Signed)
Assessment Note  Jasmin Berry is an 52 y.o. female that presents this date with several somatic complaints. Patient denies any S/I, H/I or AVH. Patient denies any prior history of self harm or SA issues. Patient is declining to answer questions associated with assessment stating, "I am not here for any crazy issues." Information to complete assessment was obtained from notes. Per notes, patient has a past medical history of hypertension, vertigo who presents to ED for multiple complaints. Her first complaint is a rash that she has had on her neck and her back intermittently for the past 3 months. States that her rash flares up anytime she is stressed. Patient appears to be a poor historian, has tangential speech. 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  She also states that she has had a flare up of her "yeast infection" on her skin. States that "I cannot even eat yeast rolls without it flaring up." Denies any suicidal ideations but states that she did have a "mental breakdown" several weeks ago in which she vaguely states that she had "a bottle of laxatives, some other medicine and Clorox that I put under my bathroom sink." Case was staffed with Jannifer Franklin MD, Cresenciano Genre who recommended patient be discharged. Patient is declining any OP resources on discharge.       Diagnosis: Deferred  Past Medical History:  Past Medical History:  Diagnosis Date  . Hypertension   . Renal disorder   . Vertigo     History reviewed. No pertinent surgical history.  Family History: No family history on file.  Social History:  reports that she has been smoking cigarettes. She has never used smokeless tobacco. She reports that she does not drink alcohol. Her drug history is not  on file.  Additional Social History:  Alcohol / Drug Use Pain Medications: See MAR Prescriptions: See MAR Over the Counter: See MAR History of alcohol / drug use?: No history of alcohol / drug abuse Longest period of sobriety (when/how long): NA Negative Consequences of Use: (NA) Withdrawal Symptoms: (NA)  CIWA: CIWA-Ar BP: (!) 148/88 Pulse Rate: 90 COWS:    Allergies:  Allergies  Allergen Reactions  . Sulfa Antibiotics Rash  . Yeast-Related Products Rash    rash    Home Medications:  (Not in a hospital admission)  OB/GYN Status:  No LMP recorded. Patient is perimenopausal.  General Assessment Data Location of Assessment: [facility] ED TTS Assessment: In system Is this a Tele or Face-to-Face Assessment?: Face-to-Face Is this an Initial Assessment or a Re-assessment for this encounter?: Initial Assessment Patient Accompanied by:: (NA) Language Other than English: No Living Arrangements: (Alone) What gender do you identify as?: Female Marital status: Single Maiden name: Dukes Pregnancy Status: No Living Arrangements: Alone Can pt return to current living arrangement?: Yes Admission Status: Voluntary Is patient capable of signing voluntary admission?: Yes Referral Source: Self/Family/Friend Insurance type: Self pay  Medical Screening Exam [facility] Walk-in ONLY) Medical Exam completed: Yes  Crisis Care Plan Living Arrangements: Alone Legal Guardian: (NA) Name of Psychiatrist: None Name of Therapist: None  Education Status Is patient currently in school?: No Is the patient employed, unemployed or receiving disability?: Employed  Risk to self with the past 6 months Suicidal Ideation: No  Has patient been a risk to self within the past 6 months prior to admission? : No Suicidal Intent: No Has patient had any suicidal intent within the past 6 months prior to admission? : No Is patient at risk for suicide?: No, but patient needs Medical Clearance Suicidal Plan?: No Has  patient had any suicidal plan within the past 6 months prior to admission? : No Access to Means: No What has been your use of drugs/alcohol within the last 12 months?: NA Previous Attempts/Gestures: No How many times?: 0 Other Self Harm Risks: (NA) Triggers for Past Attempts: Unknown Intentional Self Injurious Behavior: None Family Suicide History: No Recent stressful life event(s): (Pt declines to answer) Persecutory voices/beliefs?: No Depression: No Depression Symptoms: (Denies) Substance abuse history and/or treatment for substance abuse?: No Suicide prevention information given to non-admitted patients: Not applicable  Risk to Others within the past 6 months Homicidal Ideation: No Does patient have any lifetime risk of violence toward others beyond the six months prior to admission? : No Thoughts of Harm to Others: No Current Homicidal Intent: No Current Homicidal Plan: No Access to Homicidal Means: No Identified Victim: NA History of harm to others?: No Assessment of Violence: None Noted Violent Behavior Description: NA Does patient have access to weapons?: No Criminal Charges Pending?: No Does patient have a court date: No Is patient on probation?: No  Psychosis Hallucinations: None noted Delusions: None noted  Mental Status Report Appearance/Hygiene: Unremarkable Eye Contact: Fair Motor Activity: Freedom of movement Speech: Logical/coherent Level of Consciousness: Alert Mood: Pleasant Affect: Appropriate to circumstance Anxiety Level: Minimal Thought Processes: Coherent, Relevant Judgement: Partial Orientation: Person, Place, Time Obsessive Compulsive Thoughts/Behaviors: None  Cognitive Functioning Concentration: Normal Memory: Recent Intact, Remote Intact Is patient IDD: No Insight: Fair Impulse Control: Fair Appetite: Good Have you had any weight changes? : No Change Sleep: No Change Total Hours of Sleep: 7 Vegetative Symptoms:  None  ADLScreening [facility] Assessment Services) Patient's cognitive ability adequate to safely complete daily activities?: Yes Patient able to express need for assistance with ADLs?: Yes Independently performs ADLs?: Yes (appropriate for developmental age)  Prior Inpatient Therapy Prior Inpatient Therapy: No  Prior Outpatient Therapy Prior Outpatient Therapy: No Does patient have an ACCT team?: No Does patient have Intensive In-House Services?  : No Does patient have [facility] services? : No Does patient have P4CC services?: No  ADL Screening (condition at time of admission) Patient's cognitive ability adequate to safely complete daily activities?: Yes Is the patient deaf or have difficulty hearing?: No Does the patient have difficulty seeing, even when wearing glasses/contacts?: No Does the patient have difficulty concentrating, remembering, or making decisions?: No Patient able to express need for assistance with ADLs?: Yes Does the patient have difficulty dressing or bathing?: No Independently performs ADLs?: Yes (appropriate for developmental age) Does the patient have difficulty walking or climbing stairs?: No Weakness of Legs: None Weakness of Arms/Hands: None  Home Assistive Devices/Equipment Home Assistive Devices/Equipment: None  Therapy Consults (therapy consults require a physician order) PT Evaluation Needed: No OT Evalulation Needed: No SLP Evaluation Needed: No Abuse/Neglect Assessment (Assessment to be complete while patient is alone) Physical Abuse: Denies Verbal Abuse: Denies Sexual Abuse: Denies Exploitation of patient/patient's resources: Denies Self-Neglect: Denies Values / Beliefs Cultural Requests During Hospitalization: None Spiritual Requests During Hospitalization: None Consults Spiritual Care Consult Needed: No Social Work Consult Needed: No Merchant [employer] officer (For Healthcare) Does Patient Have a Medical Advance Directive?: No Would patient like  information on creating a  medical advance directive?: No - Patient declined          Disposition:  Disposition Initial Assessment Completed for this Encounter: Yes Disposition of Patient: Discharge Patient refused recommended treatment: No Mode of transportation if patient is discharged?: Car Patient referred to: Outpatient clinic referral  On Site Evaluation by:   Reviewed with Physician:    Alfredia Ferguson 01/14/2018 11:14 AM

## 2018-01-14 NOTE — ED Notes (Signed)
Bed: WA04 Expected date:  Expected time:  Means of arrival:  Comments: 

## 2018-03-08 ENCOUNTER — Other Ambulatory Visit: Payer: Self-pay

## 2018-03-08 ENCOUNTER — Emergency Department (HOSPITAL_COMMUNITY)
Admission: EM | Admit: 2018-03-08 | Discharge: 2018-03-08 | Disposition: A | Discharge status: Home / Self Care | Payer: Self-pay | Attending: Emergency Medicine | Admitting: Emergency Medicine

## 2018-03-08 ENCOUNTER — Encounter (HOSPITAL_COMMUNITY): Payer: Self-pay

## 2018-03-08 ENCOUNTER — Emergency Department (HOSPITAL_COMMUNITY): Payer: Self-pay

## 2018-03-08 DIAGNOSIS — Z79899 Other long term (current) drug therapy: Secondary | ICD-10-CM | POA: Insufficient documentation

## 2018-03-08 DIAGNOSIS — R030 Elevated blood-pressure reading, without diagnosis of hypertension: Secondary | ICD-10-CM

## 2018-03-08 DIAGNOSIS — R21 Rash and other nonspecific skin eruption: Secondary | ICD-10-CM | POA: Insufficient documentation

## 2018-03-08 DIAGNOSIS — R05 Cough: Secondary | ICD-10-CM | POA: Insufficient documentation

## 2018-03-08 DIAGNOSIS — I10 Essential (primary) hypertension: Secondary | ICD-10-CM | POA: Insufficient documentation

## 2018-03-08 DIAGNOSIS — R059 Cough, unspecified: Secondary | ICD-10-CM

## 2018-03-08 DIAGNOSIS — F17210 Nicotine dependence, cigarettes, uncomplicated: Secondary | ICD-10-CM | POA: Insufficient documentation

## 2018-03-08 NOTE — ED Provider Notes (Signed)
[facility]-EMERGENCY DEPT Provider Note   CSN: [id number] Arrival date & time: 03/08/18  1226     History   Chief Complaint Chief Complaint  Patient presents with  . Rash    HPI Jasmin Berry is a 52 y.o. female presenting today for concern of rash that has been present for approximately 2-3 months.  Patient with multiple ED evaluations for this rash, initially treated with Keflex and Kenalog cream without improvement.  Patient reports that she was recently seen in [facility] for the same rash earlier this week and was prescribed doxycycline and discharged.  Patient reports taking 4 days worth of doxycycline 100 mg twice daily without improvement of her rash.  Patient states that her rash is all over her body, small red bumps that are not painful and not pruritic.  Denies discharge.  Of note patient reports that her friend at bedside was recently told that he had bedbugs in his house.  Patient denies seeing bedbugs on her person or in her home.  Additionally patient reports cough for the past 1 month, intermittent, productive with white sputum.  Patient states that occasionally when she has her cough she has "Jell-O legs"which causes her to be unsteady and sit on the ground.  She denies chest pain with these incidents.  According to triage note patient was lowered to ground in triage without injury after coughing spell, vital signs were stable, SPO2 98% on room air.  HPI  Past Medical History:  Diagnosis Date  . Hypertension   . Renal disorder   . Vertigo     There are no active problems to display for this patient.   History reviewed. No pertinent surgical history.   OB History   No obstetric history on file.      Home Medications    Prior to Admission medications   Medication Sig Start Date End Date Taking? Authorizing Provider  doxycycline (VIBRAMYCIN) 100 MG capsule Take 100 mg by mouth 2 (two) times daily. 03/02/18 03/12/18 Yes [provider]  ibuprofen (ADVIL,MOTRIN) 200 MG tablet Take 800 mg by mouth daily as needed for fever.   Yes [provider]  OVER THE COUNTER MEDICATION Take by mouth daily as needed. CBD Oil -use 1 dropperful orally repeatedly daily, patient says "a lot"   Yes [provider]  ranitidine (ZANTAC) 300 MG tablet Take 300 mg by mouth at bedtime.   Yes [provider]  atenolol (TENORMIN) 25 MG tablet Take 1 tablet (25 mg total) by mouth daily. Patient not taking: Reported on 03/08/2018 08/23/17   Aviva KluverMurray, Alyssa B, PA-C  cetirizine (ZYRTEC) 10 MG tablet Take 1 tablet (10 mg total) by mouth daily. Patient not taking: Reported on 03/08/2018 08/23/17 09/22/17  Aviva KluverMurray, Alyssa B, PA-C  fluticasone (FLONASE) 50 MCG/ACT nasal spray Place 1 spray into both nostrils daily. Patient not taking: Reported on 03/08/2018 08/23/17   Elisha PonderMurray, Alyssa B, PA-C  meclizine (ANTIVERT) 25 MG tablet Take 1 tablet (25 mg total) by mouth 3 (three) times daily as needed for dizziness. Patient not taking: Reported on 03/08/2018 06/29/17   Bethel BornGekas, Kelly Marie, PA-C  naproxen (NAPROSYN) 375 MG tablet Take 1 tablet (375 mg total) by mouth 2 (two) times daily. Patient not taking: Reported on 03/08/2018 08/23/17   Aviva KluverMurray, Alyssa B, PA-C  triamcinolone cream (KENALOG) 0.1 % Apply 1 application topically 2 (two) times daily. Patient not taking: Reported on 03/08/2018 01/14/18   Dietrich PatesKhatri, Hina, PA-C    Family  History No family history on file.  Social History Social History   Tobacco Use  . Smoking status: Current Every Day Smoker    Packs/day: 1.00    Types: Cigarettes  . Smokeless tobacco: Never Used  Substance Use Topics  . Alcohol use: No  . Drug use: Yes    Comment: CBD     Allergies   Sulfa antibiotics and Yeast-related products   Review of Systems Review of Systems  Constitutional: Negative.  Negative for chills and fever.  HENT: Negative.  Negative for rhinorrhea and sore throat.   Eyes:  Negative.  Negative for visual disturbance.  Respiratory: Positive for cough. Negative for shortness of breath.   Cardiovascular: Negative.  Negative for chest pain.  Gastrointestinal: Negative.  Negative for abdominal pain, blood in stool, diarrhea, nausea and vomiting.  Genitourinary: Negative.  Negative for dysuria and hematuria.  Musculoskeletal: Negative.  Negative for arthralgias and myalgias.  Skin: Positive for rash. Negative for wound.  Neurological: Negative.  Negative for dizziness, weakness and headaches.   Physical Exam Updated Vital Signs BP (!) 154/102 (BP Location: Right Arm)   Pulse (!) 102   Temp 98.6 F (37 C) (Oral)   Resp 18   Ht 4\' 10"  (1.473 m)   Wt 41.3 kg   SpO2 99%   BMI 19.02 kg/m   Physical Exam Constitutional:      General: She is not in acute distress.    Appearance: She is well-developed and normal weight. She is not ill-appearing or diaphoretic.  HENT:     Head: Normocephalic and atraumatic.     Right Ear: External ear normal.     Left Ear: External ear normal.     Nose: Nose normal.     Mouth/Throat:     Lips: Pink.     Mouth: Mucous membranes are moist.     Pharynx: Oropharynx is clear.     Comments: The patient has normal phonation and is in control of secretions. No stridor.  Midline uvula without edema. Soft palate rises symmetrically. No tonsillar erythema, swelling or exudates. Tongue protrusion is normal, floor of mouth is soft. No trismus. No creptius on neck palpation. No gingival erythema or fluctuance noted. Mucus membranes moist. Eyes:     General: Vision grossly intact. Gaze aligned appropriately.     Extraocular Movements: Extraocular movements intact.     Conjunctiva/sclera: Conjunctivae normal.     Pupils: Pupils are equal, round, and reactive to light.     Comments: Visual fields grossly intact bilaterally  Neck:     Musculoskeletal: Full passive range of motion without pain, normal range of motion and neck supple.      Trachea: Trachea normal. No tracheal deviation.  Cardiovascular:     Rate and Rhythm: Normal rate and regular rhythm.     Pulses: Normal pulses.          Dorsalis pedis pulses are 2+ on the right side and 2+ on the left side.       Posterior tibial pulses are 2+ on the right side and 2+ on the left side.     Heart sounds: Normal heart sounds.  Pulmonary:     Effort: Pulmonary effort is normal. No respiratory distress.     Breath sounds: Normal breath sounds and air entry.  Chest:     Chest wall: No deformity, tenderness or crepitus.  Abdominal:     General: Bowel sounds are normal.     Palpations: Abdomen is soft.  Tenderness: There is no abdominal tenderness. There is no guarding or rebound.  Musculoskeletal: Normal range of motion.     Right lower leg: Normal.     Left lower leg: Normal.  Feet:     Right foot:     Protective Sensation: 3 sites tested. 3 sites sensed.     Left foot:     Protective Sensation: 3 sites tested. 3 sites sensed.  Skin:    General: Skin is warm and dry.     Capillary Refill: Capillary refill takes less than 2 seconds.     Comments: Patient with sparse erythematous macules over extremities, low back and abdomen.  Neurological:     General: No focal deficit present.     Mental Status: She is alert.     GCS: GCS eye subscore is 4. GCS verbal subscore is 5. GCS motor subscore is 6.     Comments: Speech is clear and goal oriented, follows commands Major Cranial nerves without deficit, no facial droop Normal strength in upper and lower extremities bilaterally including dorsiflexion and plantar flexion, strong and equal grip strength Sensation normal to light touch Moves extremities without ataxia, coordination intact Normal gait  Psychiatric:        Speech: Speech normal.        Behavior: Behavior normal. Behavior is cooperative.    ED Treatments / Results  Labs (all labs ordered are listed, but only abnormal results are displayed) Labs Reviewed  - No data to display  EKG EKG Interpretation  Date/Time:  Wednesday March 08 2018 15:52:01 EST Ventricular Rate:  111 PR Interval:    QRS Duration: 77 QT Interval:  339 QTC Calculation: 461 R Axis:   72 Text Interpretation:  Sinus tachycardia Biatrial enlargement No significant change since last tracing Confirmed by Shaune Pollack [phone]) on 03/08/2018 4:00:48 PM   Radiology Dg Chest 2 View  Result Date: 03/08/2018 CLINICAL DATA:  Cough. EXAM: CHEST - 2 VIEW COMPARISON:  02/02/2016 FINDINGS: Both lungs are clear. Heart and mediastinum are within normal limits. Trachea is midline. Negative for a pneumothorax. No pleural effusions. Bony thorax is intact. IMPRESSION: No active cardiopulmonary disease. Electronically Signed   By: Richarda Overlie M.D.   On: 03/08/2018 18:07    Procedures Procedures (including critical care time)  Medications Ordered in ED Medications - No data to display   Initial Impression / Assessment and Plan / ED Course  I have reviewed the triage vital signs and the nursing notes.  Pertinent labs & imaging results that were available during my care of the patient were reviewed by me and considered in my medical decision making (see chart for details).    21:29 PM: 52 year old female presenting today for a 2-84-month history of rash that is unimproved after steroid and antibiotic patient's. History of bedbugs with friend at bedside. Additionally with cough for 1 month, possible brief weakness without vital sign abnormality in triage, patient states that this happens every time she has a severe coughing spell. Discussed with Dr. Erma Heritage, agrees with CXR and EKG. ------------------------------ EKG without acute changes reviewed by Dr. Erma Heritage Chest x-ray negative  Rash consistent with insect bites possibly bedbugs Patient denies any difficulty breathing or swallowing.  Pt has a patent airway without stridor and is handling secretions without difficulty; no  angioedema. No blisters, no pustules, no warmth, no draining sinus tracts, no superficial abscesses, no bullous impetigo, no vesicles, no desquamation, no target lesions with dusky purpura or a central bulla. Not tender  to touch. No concern for superimposed infection. No concern for SJS, TEN, TSS, tick borne illness, syphilis or other life-threatening condition.   Case discussed with Dr. Donnald Garre who also reviewed EKG and agrees that patient may be discharged with PCP follow-up.  The patient was noted to have elevated BP in ED today. I have spoken with the patient regarding elevated blood pressure readings and the need for improved management. I instructed the patient to followup with their PCP within 1 week for BP check. I also counseled the patient regarding the signs and symptoms which would require an emergent visit to an emergency department for hypertensive urgency and/or hypertensive emergency.  I have advised the patient to continue her doxycycline as previously prescribed and to follow-up with primary care provider this week for further evaluation, referrals given to community clinics.  I have also advised that the patient have her apartment checked for possible insects including bedbugs.  Upon mentioning that it is a possibility that bedbugs may be because of her bites patient became very angry with her friend at bedside.  She stated that " if I have bedbugs I will kill you."  I discussed with the patient that threats are taken very seriously in the emergency department.  When directly asked whether she had plans to harm herself, others or her friend she said no.  She states that she was just beating out of anger and did not truly mean what she said.  Patient denies plan of self injury, suicidal ideation, homicidal ideation or plans to hurt others.  I then brought patient's friend Lorin Picket aside and discussed patient's statement and his safety, he laughed and said that he feels completely safe  with the patient.  The above situation was discussed with Dr. Donnald Garre prior to discharge. Patient denies suicidal or homicidal ideations or plans to hurt herself or others.  At this time patient does not appear to be a threat to herself or others.   At this time there does not appear to be any evidence of an acute emergency medical condition and the patient appears stable for discharge with appropriate outpatient follow up. Diagnosis was discussed with patient who verbalizes understanding of care plan and is agreeable to discharge. I have discussed return precautions with patient and friend who verbalize understanding of return precautions. Patient strongly encouraged to follow-up with their PCP within one week. All questions answered.  Note: Portions of this report may have been transcribed using voice recognition software. Every effort was made to ensure accuracy; however, inadvertent computerized transcription errors may still be present. Final Clinical Impressions(s) / ED Diagnoses   Final diagnoses:  Rash and nonspecific skin eruption  Cough  Elevated blood pressure reading    ED Discharge Orders    None       Elizabeth Palau 03/08/18 [id number]    Arby Barrette, MD 03/11/18 [phone]

## 2018-03-08 NOTE — ED Notes (Signed)
Pt "started to collapse" in front of DJ, tech. Pt lowered to the ground to sitting position by DJ without incidence. Pt reports no injury. Pt states that she felt weak after coughing up phlegm. Pt stated that she couldn't breathe. Pt immediately reevaluated, encouraged to take deep breaths, with resulting vital signs appropriate. Pt's oxygen level appropriate. Pt escorted via wheelchair to [facility] for eval.

## 2018-03-08 NOTE — ED Triage Notes (Signed)
Pt states that she has been sick on and off for 2 months. Pt states she has been taking abx. Pt states she has a hx of smoking a CBD pen.  Pt has red small bumps on the center of her back. Pt was dx with folliculitis at [facility]. Pt's story is difficult to follow.

## 2018-03-08 NOTE — Discharge Instructions (Addendum)
You have been diagnosed today with Rash and nonspecific skin eruption and cough.  At this time there does not appear to be the presence of an emergent medical condition, however there is always the potential for conditions to change. Please read and follow the below instructions.  Please return to the Emergency Department immediately for any new or worsening symptoms. Please be sure to follow up with your Primary Care Provider this week regarding your visit today; please call their office to schedule an appointment even if you are feeling better for a follow-up visit. Please finish your antibiotic doxycycline as previously prescribed.  Get help right away if: You cough up blood. You have difficulty breathing. Your heartbeat is very fast. You have a fever. You have unexplained weight loss. You have night sweats. You have chest pain/sob Get help right away if: You have joint pain. You have a rash. You feel more tired or sleepy than you normally do. You have neck pain. You have a headache. You feel weaker than you normally do. You have signs of an anaphylactic reaction. Signs may include: Feeling warm in the face. Itchy, red, swollen areas of skin. Swelling of your: Eyes. Lips. Face. Mouth. Tongue. Throat. Trouble with any of these: Breathing. Talking. Swallowing. Loud breathing. Feeling dizzy or light-headed. Passing out. Pain or cramps in your belly. Throwing up. Watery poop.  Please read the additional information packets attached to your discharge summary.  Do not take your medicine if  develop an itchy rash, swelling in your mouth or lips, or difficulty breathing.

## 2018-03-21 ENCOUNTER — Inpatient Hospital Stay: Payer: Self-pay

## 2018-03-21 ENCOUNTER — Inpatient Hospital Stay: Payer: Self-pay | Admitting: Family Medicine

## 2018-04-05 ENCOUNTER — Inpatient Hospital Stay: Payer: Self-pay | Admitting: Critical Care Medicine

## 2018-04-05 NOTE — Progress Notes (Deleted)
   Subjective:    Patient ID: Jasmin Berry, female    DOB: 1966/01/16, 53 y.o.   MRN: [id number]  53 y.o. F s/p ED visit 12/18 for rash ?insect bites, cough and HTN.   Here for f/u and needs PCP to establish     Review of Systems     Objective:   Physical Exam        Assessment & Plan:

## 2019-06-11 ENCOUNTER — Other Ambulatory Visit: Payer: Self-pay

## 2019-06-11 ENCOUNTER — Emergency Department (HOSPITAL_COMMUNITY): Payer: Self-pay

## 2019-06-11 ENCOUNTER — Encounter (HOSPITAL_COMMUNITY): Payer: Self-pay | Admitting: Emergency Medicine

## 2019-06-11 DIAGNOSIS — M79602 Pain in left arm: Secondary | ICD-10-CM | POA: Insufficient documentation

## 2019-06-11 DIAGNOSIS — Z79899 Other long term (current) drug therapy: Secondary | ICD-10-CM | POA: Insufficient documentation

## 2019-06-11 DIAGNOSIS — M7918 Myalgia, other site: Secondary | ICD-10-CM | POA: Insufficient documentation

## 2019-06-11 DIAGNOSIS — R110 Nausea: Secondary | ICD-10-CM | POA: Insufficient documentation

## 2019-06-11 DIAGNOSIS — R42 Dizziness and giddiness: Secondary | ICD-10-CM | POA: Insufficient documentation

## 2019-06-11 DIAGNOSIS — I10 Essential (primary) hypertension: Secondary | ICD-10-CM | POA: Insufficient documentation

## 2019-06-11 DIAGNOSIS — F17210 Nicotine dependence, cigarettes, uncomplicated: Secondary | ICD-10-CM | POA: Insufficient documentation

## 2019-06-11 LAB — BASIC METABOLIC PANEL
Anion gap: 9 (ref 5–15)
BUN: 20 mg/dL (ref 6–20)
CO2: 27 mmol/L (ref 22–32)
Calcium: 9.1 mg/dL (ref 8.9–10.3)
Chloride: 104 mmol/L (ref 98–111)
Creatinine, Ser: 0.88 mg/dL (ref 0.44–1.00)
GFR calc Af Amer: >60 mL/min (ref >60)
GFR calc non Af Amer: >60 mL/min (ref >60)
Glucose, Bld: 103 mg/dL — ABNORMAL HIGH (ref 70–99)
Potassium: 4.4 mmol/L (ref 3.5–5.1)
Sodium: 140 mmol/L (ref 135–145)

## 2019-06-11 LAB — CBC
HCT: 45.4 % (ref 36.0–46.0)
Hemoglobin: 14.9 g/dL (ref 12.0–15.0)
MCH: 30.5 pg (ref 26.0–34.0)
MCHC: 32.8 g/dL (ref 30.0–36.0)
MCV: 93 fL (ref 80.0–100.0)
Platelets: 334 10*3/uL (ref 150–400)
RBC: 4.88 MIL/uL (ref 3.87–5.11)
RDW: 12.6 % (ref 11.5–15.5)
WBC: 8.8 10*3/uL (ref 4.0–10.5)
nRBC: 0 % (ref 0.0–0.2)

## 2019-06-11 LAB — I-STAT BETA HCG BLOOD, ED (NOT ORDERABLE): I-stat hCG, quantitative: <5 m[IU]/mL (ref <5)

## 2019-06-11 LAB — TROPONIN I (HIGH SENSITIVITY): Troponin I (High Sensitivity): <2 ng/L (ref <18)

## 2019-06-11 MED ORDER — SODIUM CHLORIDE 0.9% FLUSH: 3.0000 mL | Freq: Once | INTRAVENOUS | Status: DC

## 2019-06-11 NOTE — ED Triage Notes (Signed)
Patient complains of vertigo. She believes her health issues are due to stress. Back pain that radiates to the left arm and chest. She also reports dizziness, nausea, headaches and light sensitivity. HX PTSD

## 2019-06-12 ENCOUNTER — Emergency Department (HOSPITAL_COMMUNITY)
Admission: EM | Admit: 2019-06-12 | Discharge: 2019-06-12 | Disposition: A | Discharge status: Home / Self Care | Payer: Self-pay | Attending: Emergency Medicine | Admitting: Emergency Medicine

## 2019-06-12 DIAGNOSIS — M7918 Myalgia, other site: Secondary | ICD-10-CM

## 2019-06-12 DIAGNOSIS — R42 Dizziness and giddiness: Secondary | ICD-10-CM

## 2019-06-12 HISTORY — DX: Post-traumatic stress disorder, unspecified: F43.10

## 2019-06-12 LAB — TROPONIN I (HIGH SENSITIVITY): Troponin I (High Sensitivity): 2 ng/L (ref <18)

## 2019-06-12 MED ORDER — ONDANSETRON HCL 4 MG/2ML IJ SOLN
4.0000 mg | Freq: Once | INTRAMUSCULAR | Status: AC
  Administered 2019-06-12: 03:00:00 4 mg via INTRAVENOUS
  Filled 2019-06-12: qty 2

## 2019-06-12 MED ORDER — ONDANSETRON HCL 4 MG PO TABS: 4.0000 mg | ORAL_TABLET | Freq: Four times a day (QID) | ORAL | 0 refills | Status: DC

## 2019-06-12 MED ORDER — KETOROLAC TROMETHAMINE 30 MG/ML IJ SOLN
15.0000 mg | Freq: Once | INTRAMUSCULAR | Status: AC
  Administered 2019-06-12: 15 mg via INTRAVENOUS
  Filled 2019-06-12: qty 1

## 2019-06-12 MED ORDER — MECLIZINE HCL 25 MG PO TABS
25.0000 mg | ORAL_TABLET | Freq: Once | ORAL | Status: AC
  Administered 2019-06-12: 25 mg via ORAL
  Filled 2019-06-12: qty 1

## 2019-06-12 MED ORDER — METHOCARBAMOL 500 MG PO TABS
500.0000 mg | ORAL_TABLET | Freq: Once | ORAL | Status: AC
  Administered 2019-06-12: 500 mg via ORAL
  Filled 2019-06-12: qty 1

## 2019-06-12 MED ORDER — METHOCARBAMOL 500 MG PO TABS: 500.0000 mg | ORAL_TABLET | Freq: Three times a day (TID) | ORAL | 0 refills | Status: DC | PRN

## 2019-06-12 MED ORDER — MECLIZINE HCL 25 MG PO TABS: 25.0000 mg | ORAL_TABLET | Freq: Three times a day (TID) | ORAL | 0 refills | Status: DC | PRN

## 2019-06-12 NOTE — Discharge Instructions (Addendum)
Take the medications as prescribed.  Please let [facility] know about your frustrations.

## 2019-06-12 NOTE — ED Notes (Signed)
Signature pad not attached, pt verbalized understanding
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
## 2019-06-29 ENCOUNTER — Emergency Department (HOSPITAL_COMMUNITY)
Admission: EM | Admit: 2019-06-29 | Discharge: 2019-06-29 | Disposition: A | Discharge status: Home / Self Care | Payer: Self-pay | Attending: Emergency Medicine | Admitting: Emergency Medicine

## 2019-06-29 ENCOUNTER — Encounter (HOSPITAL_COMMUNITY): Payer: Self-pay | Admitting: Emergency Medicine

## 2019-06-29 ENCOUNTER — Other Ambulatory Visit: Payer: Self-pay

## 2019-06-29 DIAGNOSIS — F17210 Nicotine dependence, cigarettes, uncomplicated: Secondary | ICD-10-CM | POA: Insufficient documentation

## 2019-06-29 DIAGNOSIS — I10 Essential (primary) hypertension: Secondary | ICD-10-CM | POA: Insufficient documentation

## 2019-06-29 DIAGNOSIS — H7291 Unspecified perforation of tympanic membrane, right ear: Secondary | ICD-10-CM | POA: Insufficient documentation

## 2019-06-29 MED ORDER — OFLOXACIN 0.3 % OT SOLN: 10.0000 [drp] | Freq: Every day | OTIC | 0 refills | Status: AC

## 2019-06-29 MED ORDER — MECLIZINE HCL 25 MG PO TABS
12.5000 mg | ORAL_TABLET | Freq: Once | ORAL | Status: AC
  Administered 2019-06-29: 16:00:00 12.5 mg via ORAL
  Filled 2019-06-29: qty 1

## 2019-06-29 MED ORDER — AMOXICILLIN-POT CLAVULANATE 875-125 MG PO TABS: 1.0000 | ORAL_TABLET | Freq: Two times a day (BID) | ORAL | 0 refills | Status: DC

## 2019-06-29 NOTE — Discharge Instructions (Signed)
You were given a prescription for antibiotics. Please take the antibiotic prescription fully.   You were given information to follow-up with an ear nose and throat doctor.  Please call the office tomorrow to schedule an appointment for follow-up.  Make sure that you do not get any water in your ear.

## 2019-06-29 NOTE — ED Provider Notes (Signed)
[facility] DEPT Provider Note   CSN: [id number] Arrival date & time: 06/29/19  1400     History Chief Complaint  Patient presents with  . Otalgia    Jasmin Berry is a 54 y.o. female.  HPI   54 y/o female with a h/o HTN, PTSH, renal disorder, vertigo, who presents to the ED today for eval of possible FB in the right ear. States she was putting an ear plug into her with tweezers. States she pushed it in too far and she felt pain and noted a "pop". She was able to remove the ear plug and noted she had some bleeding following this. She has some pain and irritation to the right ear currently. She denies and hearing loss since then. Since then she has had some nausea and vertigo. Has h/o vertigo and this feels similar. It is provoked with movement.   Past Medical History:  Diagnosis Date  . Hypertension   . PTSD (post-traumatic stress disorder)   . Renal disorder   . Vertigo     There are no problems to display for this patient.   History reviewed. No pertinent surgical history.   OB History   No obstetric history on file.     No family history on file.  Social History   Tobacco Use  . Smoking status: Current Every Day Smoker    Packs/day: 1.00    Types: Cigarettes  . Smokeless tobacco: Never Used  Substance Use Topics  . Alcohol use: No  . Drug use: Yes    Comment: CBD    Home Medications Prior to Admission medications   Medication Sig Start Date End Date Taking? Authorizing Provider  amoxicillin-clavulanate (AUGMENTIN) 875-125 MG tablet Take 1 tablet by mouth every 12 (twelve) hours. 06/29/19   Tomma Ehinger S, PA-C  atenolol (TENORMIN) 25 MG tablet Take 1 tablet (25 mg total) by mouth daily. Patient not taking: Reported on 03/08/2018 08/23/17   Langston Masker B, PA-C  baclofen (LIORESAL) 10 MG tablet Take 10 mg by mouth 3 (three) times daily as needed for muscle spasms.  05/24/19   [provider]  cetirizine (ZYRTEC) 10 MG  tablet Take 1 tablet (10 mg total) by mouth daily. Patient not taking: Reported on 03/08/2018 08/23/17 09/22/17  Langston Masker B, PA-C  escitalopram (LEXAPRO) 10 MG tablet Take 10 mg by mouth at bedtime. 05/31/19   [provider]  fluticasone (FLONASE) 50 MCG/ACT nasal spray Place 1 spray into both nostrils daily. Patient not taking: Reported on 03/08/2018 08/23/17   Langston Masker B, PA-C  hydrOXYzine (ATARAX/VISTARIL) 25 MG tablet Take 25 mg by mouth 2 (two) times daily as needed for anxiety.  05/31/19   [provider]  ibuprofen (ADVIL,MOTRIN) 200 MG tablet Take 800 mg by mouth daily as needed for fever.    [provider]  lamoTRIgine (LAMICTAL) 25 MG tablet Take 25 mg by mouth at bedtime. 05/31/19   [provider]  Magnesium Oxide, Antacid, 500 MG CAPS Take 1 capsule by mouth daily. 05/24/19   [provider]  meclizine (ANTIVERT) 25 MG tablet Take 1 tablet (25 mg total) by mouth 3 (three) times daily as needed for dizziness. 06/12/19   Rolland Porter, MD  methocarbamol (ROBAXIN) 500 MG tablet Take 1 tablet (500 mg total) by mouth every 8 (eight) hours as needed (muscle pain). 06/12/19   Rolland Porter, MD  naproxen (NAPROSYN) 375 MG tablet Take 1 tablet (375 mg total) by mouth 2 (  two) times daily. Patient not taking: Reported on 03/08/2018 08/23/17   Aviva Kluver B, PA-C  ofloxacin (FLOXIN) 0.3 % OTIC solution Place 10 drops into the right ear daily for 7 days. 06/29/19 07/06/19  Jannine Abreu S, PA-C  ondansetron (ZOFRAN) 4 MG tablet Take 1 tablet (4 mg total) by mouth every 6 (six) hours. 06/12/19   Devoria Albe, MD  triamcinolone cream (KENALOG) 0.1 % Apply 1 application topically 2 (two) times daily. Patient not taking: Reported on 03/08/2018 01/14/18   Dietrich Pates, PA-C  Turmeric 500 MG CAPS Take 1 capsule by mouth daily. 05/24/19   [provider]    Allergies    Sulfa antibiotics and Yeast-related products  Review of Systems   Review of Systems    Constitutional: Negative for fever.  HENT:       Right ear pain, no hearing loss  Gastrointestinal: Positive for nausea.  Neurological: Positive for dizziness.    Physical Exam Updated Vital Signs BP 121/77 (BP Location: Left Arm)   Pulse 72   Temp 98.1 F (36.7 C) (Oral)   Resp 18   SpO2 100%   Physical Exam Vitals and nursing note reviewed.  Constitutional:      General: She is not in acute distress.    Appearance: She is well-developed.  HENT:     Head: Normocephalic and atraumatic.     Ears:     Comments: Right TM is perforated and erythamtous. There is also an abrasion to the lower aspect of the right ear canal that is not actively bleeding. Eyes:     Extraocular Movements: Extraocular movements intact.     Conjunctiva/sclera: Conjunctivae normal.     Pupils: Pupils are equal, round, and reactive to light.     Comments: No nystagmus  Cardiovascular:     Rate and Rhythm: Normal rate.  Pulmonary:     Effort: Pulmonary effort is normal.  Musculoskeletal:        General: Normal range of motion.     Cervical back: Neck supple.  Skin:    General: Skin is warm and dry.  Neurological:     Mental Status: She is alert.     Comments: Mental Status:  Alert, thought content appropriate, able to give a coherent history. Speech fluent without evidence of aphasia. Able to follow 2 step commands without difficulty.  Cranial Nerves:  II:  pupils equal, round, reactive to light III,IV, VI: ptosis not present, extra-ocular motions intact bilaterally  V,VII: smile symmetric, facial light touch sensation equal VIII: hearing grossly normal to voice  X: uvula elevates symmetrically  XI: bilateral shoulder shrug symmetric and strong XII: midline tongue extension without fassiculations Motor:  Normal tone. 5/5 strength of BUE and BLE major muscle groups including strong and equal grip strength and dorsiflexion/plantar flexion Sensory: light touch normal in all  extremities. Cerebellar: normal finger-to-nose with bilateral upper extremities Gait: normal gait and balance.       ED Results / Procedures / Treatments   Labs (all labs ordered are listed, but only abnormal results are displayed) Labs Reviewed - No data to display  EKG None  Radiology No results found.  Procedures Procedures (including critical care time)  Medications Ordered in ED Medications  meclizine (ANTIVERT) tablet 12.5 mg (has no administration in time range)    ED Course  I have reviewed the triage vital signs and the nursing notes.  Pertinent labs & imaging results that were available during my care of the patient were  reviewed by me and considered in my medical decision making (see chart for details).    MDM Rules/Calculators/A&P                      53 year old female presenting for evaluation of right ear pain.  Had traumatic rupture of the right TM as well as an abrasion in the right ear canal.  Denies any hearing loss.  Denies any active bleeding on exam.  Will place on Augmentin and ofloxacin drops.  She also has some vertigo following this injury.  I offered to give Rx for meclizine however she declined that she would like a dose of it prior to leaving the ED.  Will give follow-up with ENT.  Advised on return precautions and questions for perforated TM.  She voiced understand plan reasons return.  Questions answered.  Patient stable for discharge. Final Clinical Impression(s) / ED Diagnoses Final diagnoses:  Perforation of right tympanic membrane    Rx / DC Orders ED Discharge Orders         Ordered    amoxicillin-clavulanate (AUGMENTIN) 875-125 MG tablet  Every 12 hours     06/29/19 1600    ofloxacin (FLOXIN) 0.3 % OTIC solution  Daily     06/29/19 1600           Raquelle Pietro S, PA-C 06/29/19 1605    Rolan Bucco, MD 06/29/19 1634

## 2019-06-29 NOTE — ED Triage Notes (Signed)
Per pt, states she used some tweezers to shove an ear plug in her right ear-states she heard a "pop"-unable to remove it, states she can still hear although it is affecting her vertigo

## 2019-08-14 ENCOUNTER — Emergency Department (HOSPITAL_COMMUNITY)
Admission: EM | Admit: 2019-08-14 | Discharge: 2019-08-14 | Disposition: A | Discharge status: Home / Self Care | Payer: Self-pay | Attending: Emergency Medicine | Admitting: Emergency Medicine

## 2019-08-14 ENCOUNTER — Emergency Department (HOSPITAL_COMMUNITY): Payer: Self-pay

## 2019-08-14 ENCOUNTER — Encounter (HOSPITAL_COMMUNITY): Payer: Self-pay

## 2019-08-14 ENCOUNTER — Other Ambulatory Visit: Payer: Self-pay

## 2019-08-14 DIAGNOSIS — Y999 Unspecified external cause status: Secondary | ICD-10-CM | POA: Insufficient documentation

## 2019-08-14 DIAGNOSIS — F17210 Nicotine dependence, cigarettes, uncomplicated: Secondary | ICD-10-CM | POA: Insufficient documentation

## 2019-08-14 DIAGNOSIS — Y9389 Activity, other specified: Secondary | ICD-10-CM | POA: Insufficient documentation

## 2019-08-14 DIAGNOSIS — M25521 Pain in right elbow: Secondary | ICD-10-CM | POA: Insufficient documentation

## 2019-08-14 DIAGNOSIS — Y929 Unspecified place or not applicable: Secondary | ICD-10-CM | POA: Insufficient documentation

## 2019-08-14 DIAGNOSIS — M25532 Pain in left wrist: Secondary | ICD-10-CM | POA: Insufficient documentation

## 2019-08-14 DIAGNOSIS — W19XXXA Unspecified fall, initial encounter: Secondary | ICD-10-CM

## 2019-08-14 DIAGNOSIS — M79622 Pain in left upper arm: Secondary | ICD-10-CM | POA: Insufficient documentation

## 2019-08-14 DIAGNOSIS — M25512 Pain in left shoulder: Secondary | ICD-10-CM | POA: Insufficient documentation

## 2019-08-14 DIAGNOSIS — W101XXA Fall (on)(from) sidewalk curb, initial encounter: Secondary | ICD-10-CM | POA: Insufficient documentation

## 2019-08-14 DIAGNOSIS — S4992XA Unspecified injury of left shoulder and upper arm, initial encounter: Secondary | ICD-10-CM

## 2019-08-14 MED ORDER — KETOROLAC TROMETHAMINE 60 MG/2ML IM SOLN
30.0000 mg | Freq: Once | INTRAMUSCULAR | Status: AC
  Administered 2019-08-14: 30 mg via INTRAMUSCULAR
  Filled 2019-08-14: qty 2

## 2019-08-14 MED ORDER — ONDANSETRON 4 MG PO TBDP
4.0000 mg | ORAL_TABLET | Freq: Once | ORAL | Status: AC
  Administered 2019-08-14: 4 mg via ORAL
  Filled 2019-08-14: qty 1

## 2019-08-14 MED ORDER — METHOCARBAMOL 500 MG PO TABS: 500.0000 mg | ORAL_TABLET | Freq: Two times a day (BID) | ORAL | 0 refills | Status: DC

## 2019-08-14 NOTE — ED Provider Notes (Signed)
[facility]-EMERGENCY DEPT Provider Note   CSN: [id number] Arrival date & time: 08/14/19  1303     History Chief Complaint  Patient presents with  . Left Shoulder Injury    Jasmin Berry is a 54 y.o. female.  HPI      Jasmin Berry is a 54 y.o. female, with a history of HTN, presenting to the ED with injuries from a fall that occurred shortly prior to arrival. States she tripped over a curb and fell onto outstretched arms. The bulk of her pain is to her left upper arm and left shoulder, sharp, severe, radiating distally in the left arm.  She also complains of right elbow pain and left wrist pain.  Denies head injury, LOC, neck/back pain, numbness, weakness, chest pain, shortness of breath, syncope, or any other complaints.   Past Medical History:  Diagnosis Date  . Hypertension   . PTSD (post-traumatic stress disorder)   . Renal disorder   . Vertigo     There are no problems to display for this patient.   History reviewed. No pertinent surgical history.   OB History   No obstetric history on file.     History reviewed. No pertinent family history.  Social History   Tobacco Use  . Smoking status: Current Every Day Smoker    Packs/day: 1.00    Types: Cigarettes  . Smokeless tobacco: Never Used  Substance Use Topics  . Alcohol use: No  . Drug use: Yes    Comment: CBD    Home Medications Prior to Admission medications   Medication Sig Start Date End Date Taking? Authorizing Provider  amoxicillin-clavulanate (AUGMENTIN) 875-125 MG tablet Take 1 tablet by mouth every 12 (twelve) hours. 06/29/19   Couture, Cortni S, PA-C  atenolol (TENORMIN) 25 MG tablet Take 1 tablet (25 mg total) by mouth daily. Patient not taking: Reported on 03/08/2018 08/23/17   Aviva Kluver B, PA-C  baclofen (LIORESAL) 10 MG tablet Take 10 mg by mouth 3 (three) times daily as needed for muscle spasms.  05/24/19   [provider]  cetirizine (ZYRTEC) 10 MG tablet  Take 1 tablet (10 mg total) by mouth daily. Patient not taking: Reported on 03/08/2018 08/23/17 09/22/17  Aviva Kluver B, PA-C  escitalopram (LEXAPRO) 10 MG tablet Take 10 mg by mouth at bedtime. 05/31/19   [provider]  fluticasone (FLONASE) 50 MCG/ACT nasal spray Place 1 spray into both nostrils daily. Patient not taking: Reported on 03/08/2018 08/23/17   Aviva Kluver B, PA-C  hydrOXYzine (ATARAX/VISTARIL) 25 MG tablet Take 25 mg by mouth 2 (two) times daily as needed for anxiety.  05/31/19   [provider]  ibuprofen (ADVIL,MOTRIN) 200 MG tablet Take 800 mg by mouth daily as needed for fever.    [provider]  lamoTRIgine (LAMICTAL) 25 MG tablet Take 25 mg by mouth at bedtime. 05/31/19   [provider]  Magnesium Oxide, Antacid, 500 MG CAPS Take 1 capsule by mouth daily. 05/24/19   [provider]  meclizine (ANTIVERT) 25 MG tablet Take 1 tablet (25 mg total) by mouth 3 (three) times daily as needed for dizziness. 06/12/19   Devoria Albe, MD  methocarbamol (ROBAXIN) 500 MG tablet Take 1 tablet (500 mg total) by mouth 2 (two) times daily. 08/14/19   Brodey Bonn C, PA-C  naproxen (NAPROSYN) 375 MG tablet Take 1 tablet (375 mg total) by mouth 2 (two) times daily. Patient not taking: Reported on 03/08/2018 08/23/17   Dayton Scrape,  Alyssa B, PA-C  ondansetron (ZOFRAN) 4 MG tablet Take 1 tablet (4 mg total) by mouth every 6 (six) hours. 06/12/19   Devoria Albe, MD  triamcinolone cream (KENALOG) 0.1 % Apply 1 application topically 2 (two) times daily. Patient not taking: Reported on 03/08/2018 01/14/18   Dietrich Pates, PA-C  Turmeric 500 MG CAPS Take 1 capsule by mouth daily. 05/24/19   [provider]    Allergies    Sulfa antibiotics and Yeast-related products  Review of Systems   Review of Systems  Respiratory: Negative for shortness of breath.   Cardiovascular: Negative for chest pain.  Gastrointestinal: Positive for nausea. Negative for abdominal pain and  vomiting.  Musculoskeletal: Positive for arthralgias. Negative for back pain and neck pain.  Neurological: Negative for dizziness, weakness, numbness and headaches.  All other systems reviewed and are negative.   Physical Exam Updated Vital Signs BP 127/87   Pulse 70   Temp 98.2 F (36.8 C) (Oral)   Resp 18   SpO2 99%   Physical Exam Vitals and nursing note reviewed.  Constitutional:      General: She is not in acute distress.    Appearance: She is well-developed. She is not diaphoretic.  HENT:     Head: Normocephalic and atraumatic.     Mouth/Throat:     Mouth: Mucous membranes are moist.     Pharynx: Oropharynx is clear.  Eyes:     Conjunctiva/sclera: Conjunctivae normal.  Cardiovascular:     Rate and Rhythm: Normal rate and regular rhythm.     Pulses: Normal pulses.          Radial pulses are 2+ on the right side and 2+ on the left side.       Posterior tibial pulses are 2+ on the right side and 2+ on the left side.     Comments: Tactile temperature in the extremities appropriate and equal bilaterally. Pulmonary:     Effort: Pulmonary effort is normal. No respiratory distress.  Abdominal:     Tenderness: There is no guarding.  Musculoskeletal:       Arms:     Cervical back: Neck supple.       Back:     Comments: Tenderness to the left anterior shoulder into the left proximal humerus.  No noted deformity, color change, or instability. No tenderness, deformity, or other abnormality noted along the clavicles. Full range of motion in the left elbow without pain or noted difficulty. No tenderness, swelling, or other signs of injury to the left forearm Some tenderness to the left ulnar wrist without swelling or noted deformity.  Pain with range of motion of the right elbow without noted deformity, swelling, or instability.  The rest of the major joints of the upper and lower extremities were palpated and ranged without tenderness, swelling, deformity, instability, or  pain with range of motion.  No midline spinal tenderness, swelling, or deformity.  Lymphadenopathy:     Cervical: No cervical adenopathy.  Skin:    General: Skin is warm and dry.  Neurological:     Mental Status: She is alert.     Comments: No noted acute cognitive deficit. Sensation grossly intact to light touch in the extremities.   Grip strengths equal bilaterally.   Strength 5/5 in all extremities.  No gait disturbance.  Coordination intact.  Cranial nerves III-XII grossly intact.  Handles oral secretions without noted difficulty.  No noted phonation or speech deficit.  Sensation grossly intact to light touch through  each of the nerve distributions of the bilateral upper extremities. Abduction and adduction of the fingers intact against resistance. Grip strength equal bilaterally. Supination and pronation intact against resistance. Strength 5/5 through the cardinal directions of the bilateral wrists. Strength 5/5 with flexion and extension of the bilateral elbows. Patient can touch the thumb to each one of the fingertips without difficulty.  Patient can hold the "OK" sign against resistance.  Psychiatric:        Mood and Affect: Mood and affect normal.        Speech: Speech normal.        Behavior: Behavior normal.     ED Results / Procedures / Treatments   Labs (all labs ordered are listed, but only abnormal results are displayed) Labs Reviewed - No data to display  EKG None  Radiology DG Elbow Complete Right  Result Date: 08/14/2019 CLINICAL DATA:  [place] today. Laceration to the posterior humerus and right elbow region. EXAM: RIGHT ELBOW - COMPLETE 3+ VIEW COMPARISON:  None. FINDINGS: No fracture or bone lesion. Elbow joint is normally spaced and aligned.  No joint effusion. Soft tissues are unremarkable.  No radiopaque foreign body. IMPRESSION: No fracture, joint abnormality or radiopaque foreign body Electronically Signed   By: Lajean Manes M.D.   On: 08/14/2019  14:14   DG Wrist Complete Left  Result Date: 08/14/2019 CLINICAL DATA:  [place] today. Left arm injury. Laceration to the posterior arm. EXAM: LEFT WRIST - COMPLETE 3+ VIEW COMPARISON:  None. FINDINGS: No fracture or bone lesion. Joints are normally spaced and aligned. Soft tissues are unremarkable. IMPRESSION: Negative. Electronically Signed   By: Lajean Manes M.D.   On: 08/14/2019 14:13   DG Shoulder Left  Result Date: 08/14/2019 CLINICAL DATA:  Fall with tenderness EXAM: LEFT SHOULDER - 2+ VIEW COMPARISON:  None. FINDINGS: There is no evidence of fracture or dislocation. There is no evidence of arthropathy or other focal bone abnormality. Soft tissues are unremarkable. IMPRESSION: Negative. Electronically Signed   By: Prudencio Pair M.D.   On: 08/14/2019 14:11   DG Humerus Left  Result Date: 08/14/2019 CLINICAL DATA:  [place] today with a left arm laceration. EXAM: LEFT HUMERUS - 2+ VIEW COMPARISON:  None. FINDINGS: No fracture.  No bone lesion. Elbow and shoulder joints are normally spaced and aligned. Soft tissues are unremarkable.  No radiopaque foreign body. IMPRESSION: No fracture, dislocation or radiopaque foreign body. Electronically Signed   By: Lajean Manes M.D.   On: 08/14/2019 14:12    Procedures Procedures (including critical care time)  Medications Ordered in ED Medications  ketorolac (TORADOL) injection 30 mg (30 mg Intramuscular Given 08/14/19 1344)  ondansetron (ZOFRAN-ODT) disintegrating tablet 4 mg (4 mg Oral Given 08/14/19 1344)    ED Course  I have reviewed the triage vital signs and the nursing notes.  Pertinent labs & imaging results that were available during my care of the patient were reviewed by me and considered in my medical decision making (see chart for details).    MDM Rules/Calculators/A&P                      Patient presents with extremity pain following a fall.  No evidence of neurovascular compromise.  I personally reviewed and interpreted the  patient's x-rays. No acute abnormalities noted on x-rays. Patient placed in a sling.  Orthopedic follow-up.  The patient was given instructions for home care as well as return precautions. Patient voices understanding of these instructions,  accepts the plan, and is comfortable with discharge.   Patient requests no narcotic pain management.  Most recent available labs were reviewed.  Creatinine on June 11, 2019 was 0.88 and BUN 20.  Toradol ordered for pain management.   Final Clinical Impression(s) / ED Diagnoses Final diagnoses:  Injury of left shoulder, initial encounter  Fall, initial encounter    Rx / DC Orders ED Discharge Orders         Ordered    methocarbamol (ROBAXIN) 500 MG tablet  2 times daily     08/14/19 1442           Anselm Pancoast, PA-C 08/14/19 1443    Lorre Nick, MD 08/14/19 1524

## 2019-08-14 NOTE — ED Notes (Signed)
An After Visit Summary was printed and given to the patient. Discharge instructions given and no further questions at this time.  

## 2019-08-14 NOTE — ED Triage Notes (Signed)
[place] and fell on her left shoulder and is having 10/10 pain. Pt able to move left hand fingers. Pt denies hitting her head, denies LOC.

## 2019-08-14 NOTE — Discharge Instructions (Addendum)
You have been seen today for a left arm injury. There were no acute abnormalities on the x-rays, including no sign of fracture or dislocation, however, there could be injuries to the soft tissues, such as the ligaments or tendons that are not seen on xrays. There could also be what are called occult fractures that are small fractures not seen on xray. Antiinflammatory medications: Take 600 mg of ibuprofen every 6 hours or 440 mg (over the counter dose) to 500 mg (prescription dose) of naproxen every 12 hours for the next 3 days. After this time, these medications may be used as needed for pain. Take these medications with food to avoid upset stomach. Choose only one of these medications, do not take them together. Acetaminophen (generic for Tylenol): Should you continue to have additional pain while taking the ibuprofen or naproxen, you may add in acetaminophen as needed. Your daily total maximum amount of acetaminophen from all sources should be limited to 4000mg /day for persons without liver problems, or 2000mg /day for those with liver problems. Methocarbamol: Methocarbamol (generic for Robaxin) is a muscle relaxer and can help relieve stiff muscles or muscle spasms.  Do not drive or perform other dangerous activities while taking this medication as it can cause drowsiness as well as changes in reaction time and judgement. Ice: May apply ice to the area over the next 24 hours for 15 minutes at a time to reduce swelling. Elevation: Keep the extremity elevated as often as possible to reduce pain and inflammation. Support: Wear the sling for support and comfort. Wear this until pain resolves.  Exercises: Start by performing these exercises a few times a week, increasing the frequency until you are performing them twice daily.  Follow up: If symptoms are improving, you may follow up with your primary care provider for any continued management. If symptoms are not starting to improve within a week, you should  follow up with the orthopedic specialist within two weeks. Return: Return to the ED for numbness, weakness, increasing pain, overall worsening symptoms, loss of function, or if symptoms are not improving, you have tried to follow up with the orthopedic specialist, and have been unable to do so.  For prescription assistance, may try using prescription discount sites or apps, such as goodrx.com or Good Rx smart phone app.

## 2019-10-25 ENCOUNTER — Ambulatory Visit (HOSPITAL_COMMUNITY): Payer: Self-pay | Admitting: Psychiatry

## 2019-11-06 ENCOUNTER — Telehealth (HOSPITAL_COMMUNITY): Payer: Self-pay

## 2020-01-01 ENCOUNTER — Telehealth (HOSPITAL_COMMUNITY): Payer: Self-pay

## 2020-01-01 ENCOUNTER — Ambulatory Visit (HOSPITAL_COMMUNITY)
Admission: EM | Admit: 2020-01-01 | Discharge: 2020-01-01 | Disposition: A | Discharge status: Home / Self Care | Payer: No Payment, Other

## 2020-01-01 ENCOUNTER — Emergency Department (HOSPITAL_COMMUNITY)
Admission: EM | Admit: 2020-01-01 | Discharge: 2020-01-01 | Disposition: A | Discharge status: Home / Self Care | Payer: Self-pay | Attending: Emergency Medicine | Admitting: Emergency Medicine

## 2020-01-01 ENCOUNTER — Other Ambulatory Visit: Payer: Self-pay

## 2020-01-01 ENCOUNTER — Encounter (HOSPITAL_COMMUNITY): Payer: Self-pay | Admitting: Emergency Medicine

## 2020-01-01 DIAGNOSIS — F4310 Post-traumatic stress disorder, unspecified: Secondary | ICD-10-CM | POA: Insufficient documentation

## 2020-01-01 DIAGNOSIS — F17210 Nicotine dependence, cigarettes, uncomplicated: Secondary | ICD-10-CM | POA: Insufficient documentation

## 2020-01-01 DIAGNOSIS — I10 Essential (primary) hypertension: Secondary | ICD-10-CM | POA: Insufficient documentation

## 2020-01-01 DIAGNOSIS — Z79899 Other long term (current) drug therapy: Secondary | ICD-10-CM | POA: Insufficient documentation

## 2020-01-01 DIAGNOSIS — Z20822 Contact with and (suspected) exposure to covid-19: Secondary | ICD-10-CM | POA: Insufficient documentation

## 2020-01-01 LAB — CBC
HCT: 45.3 % (ref 36.0–46.0)
Hemoglobin: 14.7 g/dL (ref 12.0–15.0)
MCH: 30.9 pg (ref 26.0–34.0)
MCHC: 32.5 g/dL (ref 30.0–36.0)
MCV: 95.2 fL (ref 80.0–100.0)
Platelets: 312 10*3/uL (ref 150–400)
RBC: 4.76 MIL/uL (ref 3.87–5.11)
RDW: 12.9 % (ref 11.5–15.5)
WBC: 10 10*3/uL (ref 4.0–10.5)
nRBC: 0 % (ref 0.0–0.2)

## 2020-01-01 LAB — COMPREHENSIVE METABOLIC PANEL
ALT: 17 U/L (ref 0–44)
AST: 25 U/L (ref 15–41)
Albumin: 4.4 g/dL (ref 3.5–5.0)
Alkaline Phosphatase: 88 U/L (ref 38–126)
Anion gap: 11 (ref 5–15)
BUN: 17 mg/dL (ref 6–20)
CO2: 28 mmol/L (ref 22–32)
Calcium: 9.3 mg/dL (ref 8.9–10.3)
Chloride: 101 mmol/L (ref 98–111)
Creatinine, Ser: 0.94 mg/dL (ref 0.44–1.00)
GFR, Estimated: >60 mL/min (ref >60)
Glucose, Bld: 131 mg/dL — ABNORMAL HIGH (ref 70–99)
Potassium: 4.8 mmol/L (ref 3.5–5.1)
Sodium: 140 mmol/L (ref 135–145)
Total Bilirubin: 0.2 mg/dL — ABNORMAL LOW (ref 0.3–1.2)
Total Protein: 7.9 g/dL (ref 6.5–8.1)

## 2020-01-01 LAB — SALICYLATE LEVEL: Salicylate Lvl: <7 mg/dL — ABNORMAL LOW (ref 7.0–30.0)

## 2020-01-01 LAB — RESPIRATORY PANEL BY RT PCR (FLU A&B, COVID)
Influenza A by PCR: NEGATIVE
Influenza B by PCR: NEGATIVE
SARS Coronavirus 2 by RT PCR: NEGATIVE

## 2020-01-01 LAB — RAPID URINE DRUG SCREEN, HOSP PERFORMED
Amphetamines: NOT DETECTED
Barbiturates: NOT DETECTED
Benzodiazepines: NOT DETECTED
Cocaine: NOT DETECTED
Opiates: NOT DETECTED
Tetrahydrocannabinol: POSITIVE — AB

## 2020-01-01 LAB — ACETAMINOPHEN LEVEL: Acetaminophen (Tylenol), Serum: <10 ug/mL — ABNORMAL LOW (ref 10–30)

## 2020-01-01 LAB — ETHANOL: Alcohol, Ethyl (B): <10 mg/dL (ref <10)

## 2020-01-01 LAB — PREGNANCY, URINE: Preg Test, Ur: NEGATIVE

## 2020-01-01 MED ORDER — ACETAMINOPHEN 325 MG PO TABS: 650.0000 mg | ORAL_TABLET | ORAL | Status: DC | PRN

## 2020-01-01 NOTE — ED Triage Notes (Signed)
Per [employer]-states they went to [facility] for psych eval-patient's tele conference was delayed which caused patient to become agitated-patient is manic, hyperverbal-denies SI/HI

## 2020-01-01 NOTE — ED Provider Notes (Signed)
[facility]-EMERGENCY DEPT Provider Note   CSN: [id number] Arrival date & time: 01/01/20  1246     History Chief Complaint  Patient presents with  . Psychiatric Evaluation    Jasmin Berry is a 54 y.o. female.  HPI    Patient with significant medical history of PTSD presents emergency department with chief complaint of exacerbation of her PTSD.  She states she went to [facility] today to be evaluated.  She became irate during her visit and [employer]  was sent out to calm her down to bring her here to the emergency department to be evaluated.  Patient states "I am sick and tired of talking and do not want to talk anymore".  She also endorses that she "wanted to hurt the people at [facility]" patient denies suicidal or homicidal ideations, she denies hallucinations or delusions.  She appears to be agitated but does not appear to be responding inappropriately to internal stimuli.  She states she has PTSD and feels like she is having an exacerbation of her PTSD.  She also endorses that she is all out of her medications and needs help with this.  She denies headache, fever, chills, shortness of breath, chest pain, dumping, nausea, vomiting, diarrhea.  Past Medical History:  Diagnosis Date  . Hypertension   . PTSD (post-traumatic stress disorder)   . Renal disorder   . Vertigo     There are no problems to display for this patient.   History reviewed. No pertinent surgical history.   OB History   No obstetric history on file.     No family history on file.  Social History   Tobacco Use  . Smoking status: Current Every Day Smoker    Packs/day: 1.00    Types: Cigarettes  . Smokeless tobacco: Never Used  Vaping Use  . Vaping Use: Every day  Substance Use Topics  . Alcohol use: No  . Drug use: Yes    Comment: CBD    Home Medications Prior to Admission medications   Medication Sig Start Date End Date Taking? Authorizing Provider  amoxicillin-clavulanate (AUGMENTIN)  875-125 MG tablet Take 1 tablet by mouth every 12 (twelve) hours. 06/29/19   Couture, Cortni S, PA-C  atenolol (TENORMIN) 25 MG tablet Take 1 tablet (25 mg total) by mouth daily. Patient not taking: Reported on 03/08/2018 08/23/17   Aviva Kluver B, PA-C  baclofen (LIORESAL) 10 MG tablet Take 10 mg by mouth 3 (three) times daily as needed for muscle spasms.  05/24/19   [provider]  cetirizine (ZYRTEC) 10 MG tablet Take 1 tablet (10 mg total) by mouth daily. Patient not taking: Reported on 03/08/2018 08/23/17 09/22/17  Aviva Kluver B, PA-C  escitalopram (LEXAPRO) 10 MG tablet Take 10 mg by mouth at bedtime. 05/31/19   [provider]  fluticasone (FLONASE) 50 MCG/ACT nasal spray Place 1 spray into both nostrils daily. Patient not taking: Reported on 03/08/2018 08/23/17   Aviva Kluver B, PA-C  hydrOXYzine (ATARAX/VISTARIL) 25 MG tablet Take 25 mg by mouth 2 (two) times daily as needed for anxiety.  05/31/19   [provider]  ibuprofen (ADVIL,MOTRIN) 200 MG tablet Take 800 mg by mouth daily as needed for fever.    [provider]  lamoTRIgine (LAMICTAL) 25 MG tablet Take 25 mg by mouth at bedtime. 05/31/19   [provider]  Magnesium Oxide, Antacid, 500 MG CAPS Take 1 capsule by mouth daily. 05/24/19   [provider]  meclizine (ANTIVERT) 25  MG tablet Take 1 tablet (25 mg total) by mouth 3 (three) times daily as needed for dizziness. 06/12/19   Devoria Albe, MD  methocarbamol (ROBAXIN) 500 MG tablet Take 1 tablet (500 mg total) by mouth 2 (two) times daily. 08/14/19   Joy, Shawn C, PA-C  naproxen (NAPROSYN) 375 MG tablet Take 1 tablet (375 mg total) by mouth 2 (two) times daily. Patient not taking: Reported on 03/08/2018 08/23/17   Aviva Kluver B, PA-C  ondansetron (ZOFRAN) 4 MG tablet Take 1 tablet (4 mg total) by mouth every 6 (six) hours. 06/12/19   Devoria Albe, MD  triamcinolone cream (KENALOG) 0.1 % Apply 1 application topically 2 (two) times  daily. Patient not taking: Reported on 03/08/2018 01/14/18   Dietrich Pates, PA-C  Turmeric 500 MG CAPS Take 1 capsule by mouth daily. 05/24/19   [provider]    Allergies    Sulfa antibiotics and Yeast-related products  Review of Systems   Review of Systems  Constitutional: Negative for chills and fever.  HENT: Negative for congestion, tinnitus, trouble swallowing and voice change.   Eyes: Negative for visual disturbance.  Respiratory: Negative for cough and shortness of breath.   Cardiovascular: Negative for chest pain.  Gastrointestinal: Negative for abdominal pain, diarrhea, nausea and vomiting.  Genitourinary: Negative for enuresis, flank pain, frequency and genital sores.  Musculoskeletal: Negative for back pain.  Skin: Negative for rash.  Neurological: Negative for dizziness.  Hematological: Does not bruise/bleed easily.  Psychiatric/Behavioral: Positive for agitation. Negative for self-injury and suicidal ideas. The patient is not nervous/anxious.     Physical Exam Updated Vital Signs BP (!) 127/114 (BP Location: Left Arm)   Pulse 68   Temp 98.4 F (36.9 C) (Oral)   Resp 16   SpO2 98%   Physical Exam Vitals and nursing note reviewed.  Constitutional:      General: She is not in acute distress.    Appearance: She is not ill-appearing.  HENT:     Head: Normocephalic and atraumatic.     Nose: No congestion.     Mouth/Throat:     Mouth: Mucous membranes are moist.     Pharynx: Oropharynx is clear.  Eyes:     General: No scleral icterus. Cardiovascular:     Rate and Rhythm: Normal rate and regular rhythm.     Pulses: Normal pulses.     Heart sounds: No murmur heard.  No friction rub. No gallop.   Pulmonary:     Effort: No respiratory distress.     Breath sounds: No wheezing, rhonchi or rales.  Abdominal:     General: There is no distension.     Tenderness: There is no abdominal tenderness. There is no right CVA tenderness, left CVA tenderness or  guarding.  Musculoskeletal:        General: No swelling.     Right lower leg: No edema.     Left lower leg: No edema.  Skin:    General: Skin is warm and dry.     Findings: No rash.  Neurological:     General: No focal deficit present.     Mental Status: She is alert.  Psychiatric:        Mood and Affect: Mood normal.     Comments: Patient does not appear to be responding inappropriately to internal stimuli, she is agitated, denies suicidal homicidal ideation.       ED Results / Procedures / Treatments   Labs (all labs ordered are listed,  but only abnormal results are displayed) Labs Reviewed  COMPREHENSIVE METABOLIC PANEL - Abnormal; Notable for the following components:      Result Value   Glucose, Bld 131 (*)    Total Bilirubin 0.2 (*)    All other components within normal limits  SALICYLATE LEVEL - Abnormal; Notable for the following components:   Salicylate Lvl <7.0 (*)    All other components within normal limits  ACETAMINOPHEN LEVEL - Abnormal; Notable for the following components:   Acetaminophen (Tylenol), Serum <10 (*)    All other components within normal limits  RAPID URINE DRUG SCREEN, HOSP PERFORMED - Abnormal; Notable for the following components:   Tetrahydrocannabinol POSITIVE (*)    All other components within normal limits  RESPIRATORY PANEL BY RT PCR (FLU A&B, COVID)  ETHANOL  CBC  PREGNANCY, URINE    EKG None  Radiology No results found.  Procedures Procedures (including critical care time)  Medications Ordered in ED Medications - No data to display  ED Course  I have reviewed the triage vital signs and the nursing notes.  Pertinent labs & imaging results that were available during my care of the patient were reviewed by me and considered in my medical decision making (see chart for details).    MDM Rules/Calculators/A&P                          I have personally reviewed all imaging, labs and have interpreted them.  Patient  presents with exacerbation of her PTSD.  Patient was alert, appeared to be distressed, vital signs within normal limits.  Will order med clearance labs and consult TTS for further evaluation.  CBC negative for leukocytosis or signs of anemia.  CMP negative for electrolyte abnormality, no metabolic acidosis, hyperglycemia of 131, no AKI, no anion gap noted.  Acetaminophen level less than 10, salicylate level less than 7, rapid drug screen positive for tetrahydrocannabinoids.   I have low suspicion for systemic infection as patient is nontoxic-appearing, vital signs reassuring, no leukocytosis noted on CBC.  Low suspicion for metabolic abnormality as CMP does not show any electrolyte abnormalities, liver enzymes within normal limits no anion gap noted.  Low suspicion for ACS as patient denies chest pain, shortness of breath, no signs of hypoperfusion or fluid overload on exam.  Low suspicion for acute abdomen acquiring surgical intervention as patient denies abdominal pain, tolerating p.o. without difficulty, no acute abdomen on exam.  Due to shift change patient was handed off to Sharen Heck, PA-C she is provided HPI, current work-up, likely disposition.  Patient is currently not under IVC at this time but I do not feel she is safe to leave Without being evaluated by TTS as she has made threats to hurt people.  If she does leave I would IVC her.  Paperwork has been filled out.  I recommend following recommendation of TTS.   Final Clinical Impression(s) / ED Diagnoses Final diagnoses:  PTSD (post-traumatic stress disorder)    Rx / DC Orders ED Discharge Orders    None       Carroll Sage, PA-C 01/01/20 1521    Donnetta Hutching, MD 01/02/20 [phone]

## 2020-01-01 NOTE — ED Notes (Signed)
Pt brought to the TCU and would not stay here, would not change into scrubs and would not cooperate with care offered.   She walked out.

## 2020-01-09 ENCOUNTER — Ambulatory Visit (INDEPENDENT_AMBULATORY_CARE_PROVIDER_SITE_OTHER): Payer: No Payment, Other | Admitting: Physician Assistant

## 2020-01-09 ENCOUNTER — Other Ambulatory Visit (HOSPITAL_COMMUNITY): Payer: Self-pay | Admitting: Physician Assistant

## 2020-01-09 ENCOUNTER — Encounter (HOSPITAL_COMMUNITY): Payer: Self-pay | Admitting: Physician Assistant

## 2020-01-09 ENCOUNTER — Other Ambulatory Visit: Payer: Self-pay

## 2020-01-09 VITALS — BP 106/54 | HR 73 | Ht <= 58 in | Wt 100.0 lb

## 2020-01-09 DIAGNOSIS — F32A Depression, unspecified: Secondary | ICD-10-CM

## 2020-01-09 DIAGNOSIS — F319 Bipolar disorder, unspecified: Secondary | ICD-10-CM | POA: Diagnosis not present

## 2020-01-09 MED ORDER — ESCITALOPRAM OXALATE 10 MG PO TABS: 10.0000 mg | ORAL_TABLET | Freq: Every day | ORAL | 0 refills | Status: DC

## 2020-01-09 MED ORDER — LAMOTRIGINE 25 MG PO TABS: 25.0000 mg | ORAL_TABLET | Freq: Every day | ORAL | 0 refills | Status: DC

## 2020-01-09 MED FILL — ESCITALOPRAM 10 MG TABLET: 10 | 30 days supply | Qty: 30 | Fill #0

## 2020-01-09 MED FILL — lamoTRIgine 25 MG TABS: 25 | 30 days supply | Qty: 30 | Fill #0

## 2020-01-12 ENCOUNTER — Encounter (HOSPITAL_COMMUNITY): Payer: Self-pay | Admitting: Physician Assistant
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
## 2020-02-08 ENCOUNTER — Encounter (HOSPITAL_COMMUNITY): Payer: No Payment, Other | Admitting: Physician Assistant

## 2020-05-25 ENCOUNTER — Other Ambulatory Visit: Payer: Self-pay

## 2020-05-25 ENCOUNTER — Encounter (HOSPITAL_COMMUNITY): Payer: Self-pay

## 2020-05-25 ENCOUNTER — Emergency Department (HOSPITAL_COMMUNITY): Payer: Self-pay

## 2020-05-25 ENCOUNTER — Emergency Department (HOSPITAL_COMMUNITY)
Admission: EM | Admit: 2020-05-25 | Discharge: 2020-05-25 | Disposition: A | Discharge status: Home / Self Care | Payer: Self-pay | Attending: Emergency Medicine | Admitting: Emergency Medicine

## 2020-05-25 ENCOUNTER — Emergency Department (HOSPITAL_COMMUNITY)
Admission: EM | Admit: 2020-05-25 | Discharge: 2020-05-25 | Disposition: A | Discharge status: Home / Self Care | Payer: Medicaid Other | Attending: Emergency Medicine | Admitting: Emergency Medicine

## 2020-05-25 ENCOUNTER — Encounter (HOSPITAL_COMMUNITY): Payer: Self-pay | Admitting: Emergency Medicine

## 2020-05-25 DIAGNOSIS — R112 Nausea with vomiting, unspecified: Secondary | ICD-10-CM | POA: Insufficient documentation

## 2020-05-25 DIAGNOSIS — F17210 Nicotine dependence, cigarettes, uncomplicated: Secondary | ICD-10-CM | POA: Insufficient documentation

## 2020-05-25 DIAGNOSIS — I10 Essential (primary) hypertension: Secondary | ICD-10-CM | POA: Insufficient documentation

## 2020-05-25 DIAGNOSIS — R1084 Generalized abdominal pain: Secondary | ICD-10-CM | POA: Insufficient documentation

## 2020-05-25 DIAGNOSIS — R1111 Vomiting without nausea: Secondary | ICD-10-CM

## 2020-05-25 DIAGNOSIS — R197 Diarrhea, unspecified: Secondary | ICD-10-CM | POA: Insufficient documentation

## 2020-05-25 DIAGNOSIS — K5900 Constipation, unspecified: Secondary | ICD-10-CM | POA: Insufficient documentation

## 2020-05-25 DIAGNOSIS — Z79899 Other long term (current) drug therapy: Secondary | ICD-10-CM | POA: Insufficient documentation

## 2020-05-25 LAB — COMPREHENSIVE METABOLIC PANEL
ALT: 32 U/L (ref 0–44)
AST: 26 U/L (ref 15–41)
Albumin: 4.4 g/dL (ref 3.5–5.0)
Alkaline Phosphatase: 108 U/L (ref 38–126)
Anion gap: 11 (ref 5–15)
BUN: 24 mg/dL — ABNORMAL HIGH (ref 6–20)
CO2: 25 mmol/L (ref 22–32)
Calcium: 9.6 mg/dL (ref 8.9–10.3)
Chloride: 104 mmol/L (ref 98–111)
Creatinine, Ser: 1.09 mg/dL — ABNORMAL HIGH (ref 0.44–1.00)
GFR, Estimated: >60 mL/min (ref >60)
Glucose, Bld: 135 mg/dL — ABNORMAL HIGH (ref 70–99)
Potassium: 4.8 mmol/L (ref 3.5–5.1)
Sodium: 140 mmol/L (ref 135–145)
Total Bilirubin: 0.6 mg/dL (ref 0.3–1.2)
Total Protein: 8.3 g/dL — ABNORMAL HIGH (ref 6.5–8.1)

## 2020-05-25 LAB — URINALYSIS, ROUTINE W REFLEX MICROSCOPIC
Bilirubin Urine: NEGATIVE
Glucose, UA: NEGATIVE mg/dL
Hgb urine dipstick: NEGATIVE
Ketones, ur: NEGATIVE mg/dL
Leukocytes,Ua: NEGATIVE
Nitrite: NEGATIVE
Protein, ur: NEGATIVE mg/dL
Specific Gravity, Urine: 1.043 — ABNORMAL HIGH (ref 1.005–1.030)
pH: 6 (ref 5.0–8.0)

## 2020-05-25 LAB — CBC WITH DIFFERENTIAL/PLATELET
Abs Immature Granulocytes: 0.06 10*3/uL (ref 0.00–0.07)
Basophils Absolute: 0.1 10*3/uL (ref 0.0–0.1)
Basophils Relative: 1 %
Eosinophils Absolute: 0.2 10*3/uL (ref 0.0–0.5)
Eosinophils Relative: 1 %
HCT: 44.6 % (ref 36.0–46.0)
Hemoglobin: 14.8 g/dL (ref 12.0–15.0)
Immature Granulocytes: 1 %
Lymphocytes Relative: 16 %
Lymphs Abs: 1.9 10*3/uL (ref 0.7–4.0)
MCH: 30.8 pg (ref 26.0–34.0)
MCHC: 33.2 g/dL (ref 30.0–36.0)
MCV: 92.7 fL (ref 80.0–100.0)
Monocytes Absolute: 0.7 10*3/uL (ref 0.1–1.0)
Monocytes Relative: 6 %
Neutro Abs: 9.5 10*3/uL — ABNORMAL HIGH (ref 1.7–7.7)
Neutrophils Relative %: 75 %
Platelets: 341 10*3/uL (ref 150–400)
RBC: 4.81 MIL/uL (ref 3.87–5.11)
RDW: 12.8 % (ref 11.5–15.5)
WBC: 12.4 10*3/uL — ABNORMAL HIGH (ref 4.0–10.5)
nRBC: 0 % (ref 0.0–0.2)

## 2020-05-25 LAB — LIPASE, BLOOD: Lipase: 44 U/L (ref 11–51)

## 2020-05-25 LAB — I-STAT BETA HCG BLOOD, ED (MC, WL, AP ONLY): I-stat hCG, quantitative: <5 m[IU]/mL (ref <5)

## 2020-05-25 MED ORDER — FENTANYL CITRATE (PF) 100 MCG/2ML IJ SOLN
50.0000 ug | Freq: Once | INTRAMUSCULAR | Status: AC
  Administered 2020-05-25: 50 ug via INTRAVENOUS
  Filled 2020-05-25: qty 2

## 2020-05-25 MED ORDER — IBUPROFEN 800 MG PO TABS
800.0000 mg | ORAL_TABLET | Freq: Once | ORAL | Status: AC
  Administered 2020-05-25: 800 mg via ORAL
  Filled 2020-05-25: qty 1

## 2020-05-25 MED ORDER — MAGNESIUM CITRATE PO SOLN
1.0000 | Freq: Once | ORAL | Status: AC
  Administered 2020-05-25: 1 via ORAL
  Filled 2020-05-25: qty 296

## 2020-05-25 MED ORDER — IOHEXOL 300 MG/ML  SOLN
100.0000 mL | Freq: Once | INTRAMUSCULAR | Status: AC | PRN
  Administered 2020-05-25: 100 mL via INTRAVENOUS

## 2020-05-25 MED ORDER — ONDANSETRON HCL 4 MG PO TABS: 4.0000 mg | ORAL_TABLET | Freq: Three times a day (TID) | ORAL | 0 refills | Status: DC | PRN

## 2020-05-25 MED ORDER — OXYCODONE-ACETAMINOPHEN 5-325 MG PO TABS
1.0000 | ORAL_TABLET | Freq: Once | ORAL | Status: DC
  Filled 2020-05-25: qty 1

## 2020-05-25 MED ORDER — ONDANSETRON HCL 4 MG/2ML IJ SOLN
4.0000 mg | Freq: Once | INTRAMUSCULAR | Status: AC
  Administered 2020-05-25: 4 mg via INTRAVENOUS
  Filled 2020-05-25: qty 2

## 2020-05-25 MED ORDER — SODIUM CHLORIDE 0.9 % IV BOLUS
1000.0000 mL | Freq: Once | INTRAVENOUS | Status: AC
  Administered 2020-05-25: 1000 mL via INTRAVENOUS

## 2020-05-25 NOTE — ED Provider Notes (Signed)
[facility]-EMERGENCY DEPT Provider Note   CSN: [id number] Arrival date & time: 05/25/20  1956     History No chief complaint on file.   Jasmin Berry is a 55 y.o. female.  HPI Patient is here for persistent symptoms which she describes as constipation with no bowel movement for a week, and diarrhea which occurred about a week and a half ago.  She states she vomited, prior to coming here by EMS.  She states she is using medications prescribed earlier (Zofran) when she was evaluated and treated for similar problems.  Comprehensive lab work and CT imaging, which was reassuring.  She denies fever, cough, shortness of breath.  There are no other known modifying factors.     Past Medical History:  Diagnosis Date  . Hypertension   . PTSD (post-traumatic stress disorder)   . Renal disorder   . Vertigo     Patient Active Problem List   Diagnosis Date Noted  . Depression 01/09/2020  . Bipolar disorder (HCC) 01/09/2020    History reviewed. No pertinent surgical history.   OB History   No obstetric history on file.     History reviewed. No pertinent family history.  Social History   Tobacco Use  . Smoking status: Current Every Day Smoker    Packs/day: 1.00    Types: Cigarettes  . Smokeless tobacco: Never Used  Vaping Use  . Vaping Use: Every day  Substance Use Topics  . Alcohol use: No  . Drug use: Yes    Comment: CBD    Home Medications Prior to Admission medications   Medication Sig Start Date End Date Taking? Authorizing Provider  amoxicillin-clavulanate (AUGMENTIN) 875-125 MG tablet Take 1 tablet by mouth every 12 (twelve) hours. 06/29/19   Couture, Cortni S, PA-C  atenolol (TENORMIN) 25 MG tablet Take 1 tablet (25 mg total) by mouth daily. Patient not taking: Reported on 03/08/2018 08/23/17   Aviva Kluver B, PA-C  baclofen (LIORESAL) 10 MG tablet Take 10 mg by mouth 3 (three) times daily as needed for muscle spasms.  05/24/19   [provider]  cetirizine (ZYRTEC) 10 MG tablet Take 1 tablet (10 mg total) by mouth daily. Patient not taking: Reported on 03/08/2018 08/23/17 09/22/17  Aviva Kluver B, PA-C  escitalopram (LEXAPRO) 10 MG tablet Take 1 tablet (10 mg total) by mouth at bedtime. 01/09/20 01/08/21  Nwoko, Tommas Olp, PA  fluticasone (FLONASE) 50 MCG/ACT nasal spray Place 1 spray into both nostrils daily. Patient not taking: Reported on 03/08/2018 08/23/17   Aviva Kluver B, PA-C  hydrOXYzine (ATARAX/VISTARIL) 25 MG tablet Take 25 mg by mouth 2 (two) times daily as needed for anxiety.  05/31/19   [provider]  ibuprofen (ADVIL,MOTRIN) 200 MG tablet Take 800 mg by mouth daily as needed for fever.    [provider]  lamoTRIgine (LAMICTAL) 25 MG tablet Take 1 tablet (25 mg total) by mouth at bedtime. 01/09/20 01/08/21  Karel Jarvis E, PA  Magnesium Oxide, Antacid, 500 MG CAPS Take 1 capsule by mouth daily. 05/24/19   [provider]  meclizine (ANTIVERT) 25 MG tablet Take 1 tablet (25 mg total) by mouth 3 (three) times daily as needed for dizziness. 06/12/19   Devoria Albe, MD  methocarbamol (ROBAXIN) 500 MG tablet Take 1 tablet (500 mg total) by mouth 2 (two) times daily. 08/14/19   Joy, Shawn C, PA-C  naproxen (NAPROSYN) 375 MG tablet Take 1 tablet (375 mg total) by mouth 2 (two)  times daily. Patient not taking: Reported on 03/08/2018 08/23/17   Aviva KluverMurray, Alyssa B, PA-C  ondansetron (ZOFRAN) 4 MG tablet Take 1 tablet (4 mg total) by mouth every 8 (eight) hours as needed for up to 10 doses for nausea or vomiting. 05/25/20   Curatolo, Adam, DO  triamcinolone cream (KENALOG) 0.1 % Apply 1 application topically 2 (two) times daily. Patient not taking: Reported on 03/08/2018 01/14/18   Dietrich PatesKhatri, Hina, PA-C  Turmeric 500 MG CAPS Take 1 capsule by mouth daily. 05/24/19   [provider]    Allergies    Sulfa antibiotics and Yeast-related products  Review of Systems   Review of Systems  All other  systems reviewed and are negative.   Physical Exam Updated Vital Signs BP (!) 117/100 (BP Location: Right Arm)   Pulse 97   Temp 98.5 F (36.9 C) (Oral)   Resp 20   SpO2 97%   Physical Exam Vitals and nursing note reviewed.  Constitutional:      Appearance: She is well-developed and well-nourished.  HENT:     Head: Normocephalic and atraumatic.     Right Ear: External ear normal.     Left Ear: External ear normal.  Eyes:     Extraocular Movements: EOM normal.     Conjunctiva/sclera: Conjunctivae normal.     Pupils: Pupils are equal, round, and reactive to light.  Neck:     Trachea: Phonation normal.  Cardiovascular:     Rate and Rhythm: Normal rate.  Pulmonary:     Effort: Pulmonary effort is normal. No respiratory distress.     Breath sounds: No stridor.  Chest:     Chest wall: No bony tenderness.  Abdominal:     General: There is no distension.     Palpations: Abdomen is soft.  Musculoskeletal:        General: Normal range of motion.     Cervical back: Normal range of motion and neck supple.     Comments: Normal ambulation  Skin:    General: Skin is warm, dry and intact.  Neurological:     Mental Status: She is alert and oriented to person, place, and time.     Cranial Nerves: No cranial nerve deficit.     Sensory: No sensory deficit.     Motor: No abnormal muscle tone.     Coordination: Coordination normal.  Psychiatric:        Mood and Affect: Mood and affect and mood normal.        Behavior: Behavior normal.        Thought Content: Thought content normal.        Judgment: Judgment normal.     ED Results / Procedures / Treatments   Labs (all labs ordered are listed, but only abnormal results are displayed) Labs Reviewed - No data to display  EKG None  Radiology CT ABDOMEN PELVIS W CONTRAST  Result Date: 05/25/2020 CLINICAL DATA:  Body aches EXAM: CT ABDOMEN AND PELVIS WITH CONTRAST TECHNIQUE: Multidetector CT imaging of the abdomen and pelvis was  performed using the standard protocol following bolus administration of intravenous contrast. CONTRAST:  100mL OMNIPAQUE IOHEXOL 300 MG/ML  SOLN COMPARISON:  December 21, 2009 FINDINGS: Lower chest: Scattered bibasilar atelectasis. Hepatobiliary: Portal and hepatic veins are patent.No suspicious focal lesion. Gallbladder is unremarkable. No intrahepatic or extrahepatic biliary ductal dilation. Pancreas: Unremarkable. No pancreatic ductal dilatation or surrounding inflammatory changes. Spleen: Normal in size without focal abnormality. Adrenals/Urinary Tract: Adrenal glands are unremarkable. Revisualization  of RIGHT greater than LEFT renal cortical scarring. Cyst of the superior pole of the RIGHT kidney. No hydronephrosis. Bladder is unremarkable. Stomach/Bowel: No evidence of bowel obstruction. Appendix is normal. No evidence of diffuse or focal bowel wall thickening. Vascular/Lymphatic: Circumaortic LEFT renal vein. Atherosclerotic calcifications of the aorta, mild. Aorta is normal in course and caliber. No suspicious lymphadenopathy. Reproductive: Uterus and bilateral adnexa are unremarkable. Other: No free air or free fluid. Musculoskeletal: No acute or significant osseous findings. IMPRESSION: 1. No CT etiology for acute abdominal pain identified. 2. Revisualization of RIGHT greater than LEFT renal cortical scarring. Aortic Atherosclerosis (ICD10-I70.0). Electronically Signed   By: Meda Klinefelter MD   On: 05/25/2020 12:03    Procedures Procedures   Medications Ordered in ED Medications  oxyCODONE-acetaminophen (PERCOCET/ROXICET) 5-325 MG per tablet 1 tablet (1 tablet Oral Not Given 05/25/20 2121)  magnesium citrate solution 1 Bottle (1 Bottle Oral Given 05/25/20 2120)  ibuprofen (ADVIL) tablet 800 mg (800 mg Oral Given 05/25/20 2124)    ED Course  I have reviewed the triage vital signs and the nursing notes.  Pertinent labs & imaging results that were available during my care of the patient were  reviewed by me and considered in my medical decision making (see chart for details).    MDM Rules/Calculators/A&P                           Patient Vitals for the past 24 hrs:  BP Temp Temp src Pulse Resp SpO2  05/25/20 2126 (!) 117/100 -- -- 97 20 97 %  05/25/20 2125 (!) 117/100 -- -- 95 -- 97 %  05/25/20 2125 -- -- -- -- 20 --  05/25/20 2002 139/82 98.5 F (36.9 C) Oral 95 20 98 %    10:47 PM Reevaluation with update and discussion. After initial assessment and treatment, an updated evaluation reveals no further complaints, she appears comfortable.  She declined Percocet.  She is currently already on the telephone with a friend of hers about getting a ride home, and giving medication to take.  Findings discussed with the patient and all questions were answered. Mancel Bale   Medical Decision Making:  This patient is presenting for evaluation of constipation, which does not require a range of treatment options, and is not a complaint that involves a high risk of morbidity and mortality. The differential diagnoses include constipation, enteritis. I decided to review old records, and in summary middle-aged female presenting for evaluation of constipation preceded by diarrhea 10 days ago.  I do not require additional historical information from anyone.     Critical Interventions-clinical evaluation, medication treatment, observation reassess  After These Interventions, the Patient was reevaluated and was found for discharge.  Suspect constipation, uncomplicated, not require further evaluation at this time.  CRITICAL CARE-no Performed by: Mancel Bale  Nursing Notes Reviewed/ Care Coordinated Applicable Imaging Reviewed Interpretation of Laboratory Data incorporated into ED treatment  The patient appears reasonably screened and/or stabilized for discharge and I doubt any other medical condition or other [facility] requiring further screening, evaluation, or treatment in the ED at this  time prior to discharge.  Plan: Home Medications-use stool softener of choice twice daily.  Continue current; Home Treatments-increase fiber in diet; return here if the recommended treatment, does not improve the symptoms; Recommended follow up-PCP, as needed     Final Clinical Impression(s) / ED Diagnoses Final diagnoses:  Constipation, unspecified constipation type    Rx /  DC Orders ED Discharge Orders    None       Mancel Bale, MD 05/25/20 2249

## 2020-05-25 NOTE — ED Triage Notes (Addendum)
Pt arrived via EMS, from home, c/o abd pain, n/v x3 days. Lower back pain, generalized x1 month. Generalized body aches x1 week.

## 2020-05-25 NOTE — ED Provider Notes (Signed)
[facility]-EMERGENCY DEPT Provider Note   CSN: [id number] Arrival date & time: 05/25/20  1001     History Chief Complaint  Patient presents with  . Abdominal Pain    Jasmin Berry is a 55 y.o. female.  The history is provided by the patient.  Abdominal Pain Pain location:  Generalized Pain quality: aching   Pain radiates to:  Does not radiate Pain severity:  Mild Onset quality:  Gradual Timing:  Intermittent Progression:  Waxing and waning Chronicity:  New Context: not medication withdrawal   Relieved by:  Nothing Worsened by:  Nothing Associated symptoms: constipation, diarrhea, nausea and vomiting   Associated symptoms: no chest pain, no chills, no cough, no dysuria, no fever, no hematuria, no shortness of breath and no sore throat   Risk factors: has not had multiple surgeries        Past Medical History:  Diagnosis Date  . Hypertension   . PTSD (post-traumatic stress disorder)   . Renal disorder   . Vertigo     Patient Active Problem List   Diagnosis Date Noted  . Depression 01/09/2020  . Bipolar disorder (HCC) 01/09/2020    History reviewed. No pertinent surgical history.   OB History   No obstetric history on file.     History reviewed. No pertinent family history.  Social History   Tobacco Use  . Smoking status: Current Every Day Smoker    Packs/day: 1.00    Types: Cigarettes  . Smokeless tobacco: Never Used  Vaping Use  . Vaping Use: Every day  Substance Use Topics  . Alcohol use: No  . Drug use: Yes    Comment: CBD    Home Medications Prior to Admission medications   Medication Sig Start Date End Date Taking? Authorizing Provider  ondansetron (ZOFRAN) 4 MG tablet Take 1 tablet (4 mg total) by mouth every 8 (eight) hours as needed for up to 10 doses for nausea or vomiting. 05/25/20  Yes Chaun Uemura, DO  amoxicillin-clavulanate (AUGMENTIN) 875-125 MG tablet Take 1 tablet by mouth every 12 (twelve) hours. 06/29/19    Couture, Cortni S, PA-C  atenolol (TENORMIN) 25 MG tablet Take 1 tablet (25 mg total) by mouth daily. Patient not taking: Reported on 03/08/2018 08/23/17   Aviva Kluver B, PA-C  baclofen (LIORESAL) 10 MG tablet Take 10 mg by mouth 3 (three) times daily as needed for muscle spasms.  05/24/19   [provider]  cetirizine (ZYRTEC) 10 MG tablet Take 1 tablet (10 mg total) by mouth daily. Patient not taking: Reported on 03/08/2018 08/23/17 09/22/17  Aviva Kluver B, PA-C  escitalopram (LEXAPRO) 10 MG tablet Take 1 tablet (10 mg total) by mouth at bedtime. 01/09/20 01/08/21  Nwoko, Tommas Olp, PA  fluticasone (FLONASE) 50 MCG/ACT nasal spray Place 1 spray into both nostrils daily. Patient not taking: Reported on 03/08/2018 08/23/17   Aviva Kluver B, PA-C  hydrOXYzine (ATARAX/VISTARIL) 25 MG tablet Take 25 mg by mouth 2 (two) times daily as needed for anxiety.  05/31/19   [provider]  ibuprofen (ADVIL,MOTRIN) 200 MG tablet Take 800 mg by mouth daily as needed for fever.    [provider]  lamoTRIgine (LAMICTAL) 25 MG tablet Take 1 tablet (25 mg total) by mouth at bedtime. 01/09/20 01/08/21  Karel Jarvis E, PA  Magnesium Oxide, Antacid, 500 MG CAPS Take 1 capsule by mouth daily. 05/24/19   [provider]  meclizine (ANTIVERT) 25 MG tablet Take 1 tablet (25 mg  total) by mouth 3 (three) times daily as needed for dizziness. 06/12/19   Devoria AlbeKnapp, Iva, MD  methocarbamol (ROBAXIN) 500 MG tablet Take 1 tablet (500 mg total) by mouth 2 (two) times daily. 08/14/19   Joy, Shawn C, PA-C  naproxen (NAPROSYN) 375 MG tablet Take 1 tablet (375 mg total) by mouth 2 (two) times daily. Patient not taking: Reported on 03/08/2018 08/23/17   Aviva KluverMurray, Alyssa B, PA-C  triamcinolone cream (KENALOG) 0.1 % Apply 1 application topically 2 (two) times daily. Patient not taking: Reported on 03/08/2018 01/14/18   Dietrich PatesKhatri, Hina, PA-C  Turmeric 500 MG CAPS Take 1 capsule by mouth daily. 05/24/19   [provider]    Allergies    Sulfa antibiotics and Yeast-related products  Review of Systems   Review of Systems  Constitutional: Negative for chills and fever.  HENT: Negative for ear pain and sore throat.   Eyes: Negative for pain and visual disturbance.  Respiratory: Negative for cough and shortness of breath.   Cardiovascular: Negative for chest pain and palpitations.  Gastrointestinal: Positive for abdominal pain, constipation, diarrhea, nausea and vomiting.  Genitourinary: Negative for dysuria and hematuria.  Musculoskeletal: Negative for arthralgias and back pain.  Skin: Negative for color change and rash.  Neurological: Negative for seizures and syncope.  All other systems reviewed and are negative.   Physical Exam Updated Vital Signs BP 116/71 (BP Location: Left Arm)   Pulse 78   Temp 97.7 F (36.5 C) (Oral)   Resp 16   SpO2 98%   Physical Exam Vitals and nursing note reviewed.  Constitutional:      General: She is not in acute distress.    Appearance: She is well-developed and well-nourished. She is not ill-appearing.  HENT:     Head: Normocephalic and atraumatic.     Mouth/Throat:     Mouth: Mucous membranes are moist.  Eyes:     Extraocular Movements: Extraocular movements intact.     Conjunctiva/sclera: Conjunctivae normal.  Cardiovascular:     Rate and Rhythm: Normal rate and regular rhythm.     Heart sounds: Normal heart sounds. No murmur heard.   Pulmonary:     Effort: Pulmonary effort is normal. No respiratory distress.     Breath sounds: Normal breath sounds.  Abdominal:     General: There is no distension.     Palpations: Abdomen is soft.     Tenderness: There is generalized abdominal tenderness.  Musculoskeletal:        General: No edema.     Cervical back: Neck supple.  Skin:    General: Skin is warm and dry.     Capillary Refill: Capillary refill takes less than 2 seconds.  Neurological:     General: No focal deficit present.      Mental Status: She is alert.  Psychiatric:        Mood and Affect: Mood and affect normal.     ED Results / Procedures / Treatments   Labs (all labs ordered are listed, but only abnormal results are displayed) Labs Reviewed  CBC WITH DIFFERENTIAL/PLATELET - Abnormal; Notable for the following components:      Result Value   WBC 12.4 (*)    Neutro Abs 9.5 (*)    All other components within normal limits  COMPREHENSIVE METABOLIC PANEL - Abnormal; Notable for the following components:   Glucose, Bld 135 (*)    BUN 24 (*)    Creatinine, Ser 1.09 (*)    Total  Protein 8.3 (*)    All other components within normal limits  URINALYSIS, ROUTINE W REFLEX MICROSCOPIC - Abnormal; Notable for the following components:   Color, Urine STRAW (*)    Specific Gravity, Urine 1.043 (*)    All other components within normal limits  LIPASE, BLOOD  I-STAT BETA HCG BLOOD, ED ([facility], [facility], [facility] ONLY)    EKG None  Radiology CT ABDOMEN PELVIS W CONTRAST  Result Date: 05/25/2020 CLINICAL DATA:  Body aches EXAM: CT ABDOMEN AND PELVIS WITH CONTRAST TECHNIQUE: Multidetector CT imaging of the abdomen and pelvis was performed using the standard protocol following bolus administration of intravenous contrast. CONTRAST:  OMNIPAQUE IOHEXOL 300 MG/ML  SOLN COMPARISON:  December 21, 2009 FINDINGS: Lower chest: Scattered bibasilar atelectasis. Hepatobiliary: Portal and hepatic veins are patent.No suspicious focal lesion. Gallbladder is unremarkable. No intrahepatic or extrahepatic biliary ductal dilation. Pancreas: Unremarkable. No pancreatic ductal dilatation or surrounding inflammatory changes. Spleen: Normal in size without focal abnormality. Adrenals/Urinary Tract: Adrenal glands are unremarkable. Revisualization of RIGHT greater than LEFT renal cortical scarring. Cyst of the superior pole of the RIGHT kidney. No hydronephrosis. Bladder is unremarkable. Stomach/Bowel: No evidence of bowel obstruction. Appendix is normal.  No evidence of diffuse or focal bowel wall thickening. Vascular/Lymphatic: Circumaortic LEFT renal vein. Atherosclerotic calcifications of the aorta, mild. Aorta is normal in course and caliber. No suspicious lymphadenopathy. Reproductive: Uterus and bilateral adnexa are unremarkable. Other: No free air or free fluid. Musculoskeletal: No acute or significant osseous findings. IMPRESSION: 1. No CT etiology for acute abdominal pain identified. 2. Revisualization of RIGHT greater than LEFT renal cortical scarring. Aortic Atherosclerosis (ICD10-I70.0). Electronically Signed   By: Meda Klinefelter MD   On: 05/25/2020 12:03    Procedures Procedures   Medications Ordered in ED Medications  sodium chloride 0.9 % bolus 1,000 mL (1,000 mLs Intravenous New Bag/Given 05/25/20 1050)  ondansetron (ZOFRAN) injection 4 mg (4 mg Intravenous Given 05/25/20 1050)  fentaNYL (SUBLIMAZE) injection 50 mcg (50 mcg Intravenous Given 05/25/20 1055)  iohexol (OMNIPAQUE) 300 MG/ML solution 100 mL (100 mLs Intravenous Contrast Given 05/25/20 1140)    ED Course  I have reviewed the triage vital signs and the nursing notes.  Pertinent labs & imaging results that were available during my care of the patient were reviewed by me and considered in my medical decision making (see chart for details).    MDM Rules/Calculators/A&P                          Jasmin Berry is a 55 year old female here with nausea, vomiting, diarrhea. Patient with normal vitals. No fever. Symptoms for the last several days. Now feels constipated after initially having diarrhea. She has diffuse tenderness on abdominal exam. She denies any history of abdominal surgery. Concern for colitis versus viral gastroenteritis versus bowel obstruction versus UTI. Patient to have lab work including CT scan abdomen and pelvis. Given IV fluids, IV Zofran, IV fentanyl.  Patient with white count of 12.4 but otherwise unremarkable labs. CT scan overall unremarkable. No  colitis, no obstruction, no appendicitis. Suspect viral process. Will write for Zofran. Feeling better and discharged in ED in good condition.  This chart was dictated using voice recognition software.  Despite best efforts to proofread,  errors can occur which can change the documentation meaning.    Final Clinical Impression(s) / ED Diagnoses Final diagnoses:  Vomiting without nausea, intractability of vomiting not specified, unspecified vomiting type    Rx /  DC Orders ED Discharge Orders         Ordered    ondansetron (ZOFRAN) 4 MG tablet  Every 8 hours PRN        05/25/20 1337           Virgina Norfolk, DO 05/25/20 1339

## 2020-05-25 NOTE — ED Triage Notes (Signed)
Pt came back in by EMS. She was seen earlier today and prescribed Zofran. She states that she took some of her Zofran at home and does not feel any better. Reports continued abdominal pain- it has not changed since this morning.  She also mentioned that her cable is out and she ate bad eggs.

## 2020-05-25 NOTE — Discharge Instructions (Addendum)
Use a stool softener such as MiraLAX, or Colace, twice a day, to improve your stooling.  Try to eat foods which contain fiber in them and drink plenty of water to encourage stooling

## 2020-07-15 ENCOUNTER — Emergency Department (HOSPITAL_COMMUNITY)
Admission: EM | Admit: 2020-07-15 | Discharge: 2020-07-16 | Disposition: A | Discharge status: Home / Self Care | Payer: Self-pay | Attending: Emergency Medicine | Admitting: Emergency Medicine

## 2020-07-15 ENCOUNTER — Encounter (HOSPITAL_COMMUNITY): Payer: Self-pay

## 2020-07-15 ENCOUNTER — Other Ambulatory Visit: Payer: Self-pay

## 2020-07-15 DIAGNOSIS — F17210 Nicotine dependence, cigarettes, uncomplicated: Secondary | ICD-10-CM | POA: Insufficient documentation

## 2020-07-15 DIAGNOSIS — F419 Anxiety disorder, unspecified: Secondary | ICD-10-CM | POA: Insufficient documentation

## 2020-07-15 DIAGNOSIS — Z79899 Other long term (current) drug therapy: Secondary | ICD-10-CM | POA: Insufficient documentation

## 2020-07-15 DIAGNOSIS — F319 Bipolar disorder, unspecified: Secondary | ICD-10-CM | POA: Insufficient documentation

## 2020-07-15 DIAGNOSIS — I10 Essential (primary) hypertension: Secondary | ICD-10-CM | POA: Insufficient documentation

## 2020-07-15 DIAGNOSIS — R45850 Homicidal ideations: Secondary | ICD-10-CM | POA: Insufficient documentation

## 2020-07-15 DIAGNOSIS — Z20822 Contact with and (suspected) exposure to covid-19: Secondary | ICD-10-CM | POA: Insufficient documentation

## 2020-07-15 DIAGNOSIS — R45851 Suicidal ideations: Secondary | ICD-10-CM

## 2020-07-15 LAB — CBC WITH DIFFERENTIAL/PLATELET
Abs Immature Granulocytes: 0.01 10*3/uL (ref 0.00–0.07)
Basophils Absolute: 0 10*3/uL (ref 0.0–0.1)
Basophils Relative: 1 %
Eosinophils Absolute: 0.2 10*3/uL (ref 0.0–0.5)
Eosinophils Relative: 2 %
HCT: 42 % (ref 36.0–46.0)
Hemoglobin: 13.9 g/dL (ref 12.0–15.0)
Immature Granulocytes: 0 %
Lymphocytes Relative: 29 %
Lymphs Abs: 2.2 10*3/uL (ref 0.7–4.0)
MCH: 31 pg (ref 26.0–34.0)
MCHC: 33.1 g/dL (ref 30.0–36.0)
MCV: 93.8 fL (ref 80.0–100.0)
Monocytes Absolute: 0.6 10*3/uL (ref 0.1–1.0)
Monocytes Relative: 7 %
Neutro Abs: 4.7 10*3/uL (ref 1.7–7.7)
Neutrophils Relative %: 61 %
Platelets: 300 10*3/uL (ref 150–400)
RBC: 4.48 MIL/uL (ref 3.87–5.11)
RDW: 13 % (ref 11.5–15.5)
WBC: 7.7 10*3/uL (ref 4.0–10.5)
nRBC: 0 % (ref 0.0–0.2)

## 2020-07-15 LAB — COMPREHENSIVE METABOLIC PANEL
ALT: 16 U/L (ref 0–44)
AST: 20 U/L (ref 15–41)
Albumin: 4 g/dL (ref 3.5–5.0)
Alkaline Phosphatase: 75 U/L (ref 38–126)
Anion gap: 9 (ref 5–15)
BUN: 24 mg/dL — ABNORMAL HIGH (ref 6–20)
CO2: 25 mmol/L (ref 22–32)
Calcium: 9.3 mg/dL (ref 8.9–10.3)
Chloride: 107 mmol/L (ref 98–111)
Creatinine, Ser: 0.72 mg/dL (ref 0.44–1.00)
GFR, Estimated: >60 mL/min (ref >60)
Glucose, Bld: 85 mg/dL (ref 70–99)
Potassium: 4.5 mmol/L (ref 3.5–5.1)
Sodium: 141 mmol/L (ref 135–145)
Total Bilirubin: 0.6 mg/dL (ref 0.3–1.2)
Total Protein: 7.7 g/dL (ref 6.5–8.1)

## 2020-07-15 LAB — RESP PANEL BY RT-PCR (FLU A&B, COVID) ARPGX2
Influenza A by PCR: NEGATIVE
Influenza B by PCR: NEGATIVE
SARS Coronavirus 2 by RT PCR: NEGATIVE

## 2020-07-15 LAB — RAPID URINE DRUG SCREEN, HOSP PERFORMED
Amphetamines: NOT DETECTED
Barbiturates: NOT DETECTED
Benzodiazepines: NOT DETECTED
Cocaine: NOT DETECTED
Opiates: NOT DETECTED
Tetrahydrocannabinol: POSITIVE — AB

## 2020-07-15 LAB — I-STAT BETA HCG BLOOD, ED (MC, WL, AP ONLY): I-stat hCG, quantitative: <5 m[IU]/mL (ref <5)

## 2020-07-15 LAB — ETHANOL: Alcohol, Ethyl (B): <10 mg/dL (ref <10)

## 2020-07-15 MED ORDER — BACLOFEN 10 MG PO TABS: 10.0000 mg | ORAL_TABLET | Freq: Three times a day (TID) | ORAL | Status: DC | PRN

## 2020-07-15 MED ORDER — HYDROCORTISONE 1 % EX CREA
TOPICAL_CREAM | Freq: Two times a day (BID) | CUTANEOUS | Status: DC | PRN
  Filled 2020-07-15: qty 28

## 2020-07-15 MED ORDER — LORAZEPAM 1 MG PO TABS: 1.0000 mg | ORAL_TABLET | ORAL | Status: DC | PRN

## 2020-07-15 MED ORDER — RISPERIDONE 1 MG PO TBDP: 2.0000 mg | ORAL_TABLET | Freq: Three times a day (TID) | ORAL | Status: DC | PRN

## 2020-07-15 MED ORDER — DIPHENHYDRAMINE HCL 25 MG PO CAPS
50.0000 mg | ORAL_CAPSULE | Freq: Every evening | ORAL | Status: DC | PRN
  Administered 2020-07-16: 50 mg via ORAL
  Filled 2020-07-15: qty 2

## 2020-07-15 MED ORDER — HYDROXYZINE HCL 25 MG PO TABS: 25.0000 mg | ORAL_TABLET | Freq: Two times a day (BID) | ORAL | Status: DC | PRN

## 2020-07-15 MED ORDER — IBUPROFEN 800 MG PO TABS: 800.0000 mg | ORAL_TABLET | Freq: Every day | ORAL | Status: DC | PRN

## 2020-07-15 MED ORDER — ESCITALOPRAM OXALATE 10 MG PO TABS: 10.0000 mg | ORAL_TABLET | Freq: Every day | ORAL | Status: DC

## 2020-07-15 MED ORDER — LAMOTRIGINE 25 MG PO TABS: 25.0000 mg | ORAL_TABLET | Freq: Every day | ORAL | Status: DC

## 2020-07-15 MED ORDER — ZIPRASIDONE MESYLATE 20 MG IM SOLR: 20.0000 mg | INTRAMUSCULAR | Status: DC | PRN

## 2020-07-15 NOTE — ED Provider Notes (Signed)
[facility]-EMERGENCY DEPT Provider Note   CSN: [id number] Arrival date & time: 07/15/20  1237     History Chief Complaint  Patient presents with  . Anxiety  . Suicidal    Jasmin Berry is a 55 y.o. female.  HPI Presents with her behavioral health caseworker who assists with the history. She presents today due to suicidal ideation, and some clinician towards harm towards the manager of her apartment complex.  She notes a history of depression, for which she takes Lexapro.  She recently has developed suicidal ideation without a clear plan.  Today after relaying this to her caseworker she was brought here for evaluation. She denies physical pain.  She has seemingly been taking all of her medication as directed.    Past Medical History:  Diagnosis Date  . Hypertension   . PTSD (post-traumatic stress disorder)   . Renal disorder   . Vertigo     Patient Active Problem List   Diagnosis Date Noted  . Depression 01/09/2020  . Bipolar disorder (HCC) 01/09/2020    History reviewed. No pertinent surgical history.   OB History   No obstetric history on file.     History reviewed. No pertinent family history.  Social History   Tobacco Use  . Smoking status: Current Every Day Smoker    Packs/day: 1.00    Types: Cigarettes  . Smokeless tobacco: Never Used  Vaping Use  . Vaping Use: Every day  Substance Use Topics  . Alcohol use: No  . Drug use: Yes    Comment: CBD    Home Medications Prior to Admission medications   Medication Sig Start Date End Date Taking? Authorizing Provider  amoxicillin-clavulanate (AUGMENTIN) 875-125 MG tablet Take 1 tablet by mouth every 12 (twelve) hours. 06/29/19   Couture, Cortni S, PA-C  atenolol (TENORMIN) 25 MG tablet Take 1 tablet (25 mg total) by mouth daily. Patient not taking: Reported on 03/08/2018 08/23/17   Aviva Kluver B, PA-C  baclofen (LIORESAL) 10 MG tablet Take 10 mg by mouth 3 (three) times daily as needed  for muscle spasms.  05/24/19   [provider]  cetirizine (ZYRTEC) 10 MG tablet Take 1 tablet (10 mg total) by mouth daily. Patient not taking: Reported on 03/08/2018 08/23/17 09/22/17  Aviva Kluver B, PA-C  escitalopram (LEXAPRO) 10 MG tablet TAKE 1 TABLET (10 MG TOTAL) BY MOUTH AT BEDTIME. 01/09/20 01/08/21  Nwoko, Tommas Olp, PA  fluticasone (FLONASE) 50 MCG/ACT nasal spray Place 1 spray into both nostrils daily. Patient not taking: Reported on 03/08/2018 08/23/17   Aviva Kluver B, PA-C  hydrOXYzine (ATARAX/VISTARIL) 25 MG tablet Take 25 mg by mouth 2 (two) times daily as needed for anxiety.  05/31/19   [provider]  ibuprofen (ADVIL,MOTRIN) 200 MG tablet Take 800 mg by mouth daily as needed for fever.    [provider]  lamoTRIgine (LAMICTAL) 25 MG tablet TAKE 1 TABLET (25 MG TOTAL) BY MOUTH AT BEDTIME. 01/09/20 01/08/21  Nwoko, Stephens Shire E, PA  Magnesium Oxide, Antacid, 500 MG CAPS Take 1 capsule by mouth daily. 05/24/19   [provider]  meclizine (ANTIVERT) 25 MG tablet Take 1 tablet (25 mg total) by mouth 3 (three) times daily as needed for dizziness. 06/12/19   Devoria Albe, MD  methocarbamol (ROBAXIN) 500 MG tablet Take 1 tablet (500 mg total) by mouth 2 (two) times daily. 08/14/19   Joy, Shawn C, PA-C  naproxen (NAPROSYN) 375 MG tablet Take 1 tablet (375 mg  total) by mouth 2 (two) times daily. Patient not taking: Reported on 03/08/2018 08/23/17   Aviva Kluver B, PA-C  ondansetron (ZOFRAN) 4 MG tablet Take 1 tablet (4 mg total) by mouth every 8 (eight) hours as needed for up to 10 doses for nausea or vomiting. 05/25/20   Curatolo, Adam, DO  triamcinolone cream (KENALOG) 0.1 % Apply 1 application topically 2 (two) times daily. Patient not taking: Reported on 03/08/2018 01/14/18   Dietrich Pates, PA-C  Turmeric 500 MG CAPS Take 1 capsule by mouth daily. 05/24/19   [provider]    Allergies    Sulfa antibiotics and Yeast-related products  Review of  Systems   Review of Systems  Constitutional:       Per HPI, otherwise negative  HENT:       Per HPI, otherwise negative  Respiratory:       Per HPI, otherwise negative  Cardiovascular:       Per HPI, otherwise negative  Gastrointestinal: Negative for vomiting.  Endocrine:       Negative aside from HPI  Genitourinary:       Neg aside from HPI   Musculoskeletal:       Per HPI, otherwise negative  Skin: Negative.   Neurological: Negative for syncope.  Psychiatric/Behavioral: Positive for dysphoric mood and suicidal ideas. The patient is nervous/anxious.     Physical Exam Updated Vital Signs BP 110/69   Pulse 71   Temp 97.8 F (36.6 C) (Oral)   Resp 17   SpO2 97%   Physical Exam Vitals and nursing note reviewed.  Constitutional:      General: She is not in acute distress.    Appearance: She is well-developed.  HENT:     Head: Normocephalic and atraumatic.  Eyes:     Conjunctiva/sclera: Conjunctivae normal.  Pulmonary:     Effort: Pulmonary effort is normal. No respiratory distress.     Breath sounds: No stridor.  Abdominal:     General: There is no distension.  Skin:    General: Skin is warm and dry.  Neurological:     Mental Status: She is alert and oriented to person, place, and time.     Cranial Nerves: No cranial nerve deficit.  Psychiatric:        Mood and Affect: Mood is anxious. Affect is labile.        Thought Content: Thought content includes homicidal and suicidal ideation. Thought content does not include homicidal or suicidal plan.        Cognition and Memory: Cognition is not impaired. Memory is not impaired.     ED Results / Procedures / Treatments   Labs (all labs ordered are listed, but only abnormal results are displayed) Labs Reviewed  COMPREHENSIVE METABOLIC PANEL - Abnormal; Notable for the following components:      Result Value   BUN 24 (*)    All other components within normal limits  RAPID URINE DRUG SCREEN, HOSP PERFORMED -  Abnormal; Notable for the following components:   Tetrahydrocannabinol POSITIVE (*)    All other components within normal limits  RESP PANEL BY RT-PCR (FLU A&B, COVID) [id number]  ETHANOL  CBC WITH DIFFERENTIAL/PLATELET  I-STAT BETA HCG BLOOD, ED ([facility], [facility], [facility] ONLY)  I-STAT BETA HCG BLOOD, ED ([facility], [facility], [facility] ONLY)     Procedures Procedures   Medications Ordered in ED Medications  risperiDONE (RISPERDAL M-TABS) disintegrating tablet 2 mg (has no administration in time range)    And  LORazepam (  ATIVAN) tablet 1 mg (has no administration in time range)    And  ziprasidone (GEODON) injection 20 mg (has no administration in time range)  baclofen (LIORESAL) tablet 10 mg (has no administration in time range)  escitalopram (LEXAPRO) tablet 10 mg (has no administration in time range)  hydrOXYzine (ATARAX/VISTARIL) tablet 25 mg (has no administration in time range)  ibuprofen (ADVIL) tablet 800 mg (has no administration in time range)  lamoTRIgine (LAMICTAL) tablet 25 mg (has no administration in time range)    ED Course  I have reviewed the triage vital signs and the nursing notes.  Pertinent labs & imaging results that were available during my care of the patient were reviewed by me and considered in my medical decision making (see chart for details).  Adult female with a history of depression presents with concern for suicidal ideation.  Onset seems to be related to housing difficulty and antagonistic relationship with apartment Production designer, theatre/television/film.  Patient has no discrete plan, but given her history of depression, her vocal suicidal ideation she was medically cleared for behavioral health evaluation.  Absence of physical exam complaints, vital sign abnormalities is reassuring. Final Clinical Impression(s) / ED Diagnoses Final diagnoses:  Suicidal ideation     Gerhard Munch, MD 07/15/20 [phone]

## 2020-07-15 NOTE — ED Triage Notes (Signed)
Pt states she having suicidal thoughts but does not want to talk about It

## 2020-07-15 NOTE — Care Management (Signed)
Disposition: Clinical information reviewed with Vernard Gambles, NP who recommends inpatient treatment. CSW to seek bed placement. Patient is at high risk for suicide and needs a Psychologist, educational. Disposition provided to Essie Hart, RN through secure chat.   Patient has been referred to [facility].
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
## 2020-07-16 DIAGNOSIS — R45851 Suicidal ideations: Secondary | ICD-10-CM

## 2020-07-16 NOTE — BH Assessment (Signed)
BHH Assessment Progress Note   Per Vernard Gambles, NP, this voluntary pt does not require psychiatric hospitalization at this time.  Pt is psychiatrically cleared.  Discharge instructions include recommendation to continue treatment with [facility].  A TOC consult will be ordered to address pt's psychosocial needs.  EDP Derwood Kaplan, MD and pt's nurses, Jonny Ruiz and Whitney Post, have been notified.  Doylene Canning, MA Triage Specialist [phone]

## 2020-07-16 NOTE — Consult Note (Addendum)
[facility] Face-to-Face Psychiatry Consult   Reason for Consult:  Psychiatric assessment due to Suicidal ideations.  Referring Physician:  Kem Boroughs MD Patient Identification: Jasmin Berry MRN:  [id number] Principal Diagnosis: Suicidal ideations Diagnosis:  Principal Problem:   Suicidal ideations  Total Time spent with patient: 30 minutes  Subjective:   Jasmin Berry is a 55 y.o. female patient admitted voluntarily with suicidal and homicidal ideations.  Jasmin Berry, 55 y.o., female patient seen face to face by by this provider, consulted with Dr. Lucianne Muss, and chart reviewed on 07/16/20.  On evaluation Jasmin Berry reports,  "I have had time to rest and cool off" and "I have been through a lot in my life and I get angry when I feel like someone is taking advantage of me". States she feels rested this morning and is ready to go home.   HPI:    During evaluation Jasmin Berry is In sitting position in bed in no acute distress.  She is alert, oriented x 4, calm and cooperative. She reports she is anxious because she has a lot to do before she has to move in 12 days. She reports her mood as "great" with congruent affect. She does not appear to be responding to internal/external stimuli or delusional thoughts.  Patient denies suicidal/self-harm/homicidal ideation, psychosis, and paranoia.  Patient answered question appropriately.    Patient reports that she still gets mad when she thinks of her situation with her apartment manager. States, " I know I did the right thing by coming to the hospital last night, I was just too angry and I needed to be here".   Reports that she feels safe to go home. She no longer feels suicidal and is no longer having any homicidal thoughts. Reports that she was so angry yesterday, " I didn't really mean it". Reports that she will avoid situations that may cause her to get angry. States she is afraid of being homeless. States the anger and suicidal feelings were triggered by  her heated argument with apartment Production designer, theatre/television/film. Discussed additional coping skills for anger. States she currently does art therapy.  States she is a suicide survivor. States she would never attempt to end her life again. Reports "I learned my lesson".  Reports she does not have any family and no type of support system buts states she manages on her own. She does have [facility] services in place. States she is not sure of her next appointment.    Past Psychiatric History: Patient reports she has been diagnosed, PTSD, Depression, Borderline Personality Disorder and Bipolar  Risk to Self:  pt denies Risk to Others:  pt denies Prior Inpatient Therapy:  yes Prior Outpatient Therapy:  yes  Past Medical History:  Past Medical History:  Diagnosis Date  . Hypertension   . PTSD (post-traumatic stress disorder)   . Renal disorder   . Vertigo    History reviewed. No pertinent surgical history. Family History: History reviewed. No pertinent family history. Family Psychiatric  History: unkown Social History:  Social History   Substance and Sexual Activity  Alcohol Use No     Social History   Substance and Sexual Activity  Drug Use Yes   Comment: CBD    Social History   Socioeconomic History  . Marital status: Single    Spouse name: Not on file  . Number of children: Not on file  . Years of education: Not on file  . Highest education level: Not on file  Occupational History  .  Not on file  Tobacco Use  . Smoking status: Current Every Day Smoker    Packs/day: 1.00    Types: Cigarettes  . Smokeless tobacco: Never Used  Vaping Use  . Vaping Use: Every day  Substance and Sexual Activity  . Alcohol use: No  . Drug use: Yes    Comment: CBD  . Sexual activity: Not on file  Other Topics Concern  . Not on file  Social History Narrative  . Not on file   Social Determinants of Health   Financial Resource Strain: Not on file  Food Insecurity: Not on file  Transportation Needs: Not on  file  Physical Activity: Not on file  Stress: Not on file  Social Connections: Not on file   Additional Social History:    Allergies:   Allergies  Allergen Reactions  . Sulfa Antibiotics Rash  . Yeast-Related Products Rash    rash    Labs:  Results for orders placed or performed during the hospital encounter of 07/15/20 (from the past 48 hour(s))  Comprehensive metabolic panel     Status: Abnormal   Collection Time: 07/15/20  3:00 PM  Result Value Ref Range   Sodium 141 135 - 145 mmol/L   Potassium 4.5 3.5 - 5.1 mmol/L   Chloride 107 98 - 111 mmol/L   CO2 25 22 - 32 mmol/L   Glucose, Bld 85 70 - 99 mg/dL    Comment: Glucose reference range applies only to samples taken after fasting for at least 8 hours.   BUN 24 (H) 6 - 20 mg/dL   Creatinine, Ser 0.980.72 0.44 - 1.00 mg/dL   Calcium 9.3 8.9 - 11.910.3 mg/dL   Total Protein 7.7 6.5 - 8.1 g/dL   Albumin 4.0 3.5 - 5.0 g/dL   AST 20 15 - 41 U/L   ALT 16 0 - 44 U/L   Alkaline Phosphatase 75 38 - 126 U/L   Total Bilirubin 0.6 0.3 - 1.2 mg/dL   GFR, Estimated >14>60 >78>60 mL/min    Comment: (NOTE) Calculated using the CKD-EPI Creatinine Equation (2021)    Anion gap 9 5 - 15    Comment: Performed at [facility], [address] [place], [place] [address]  Ethanol     Status: None   Collection Time: 07/15/20  3:00 PM  Result Value Ref Range   Alcohol, Ethyl (B) <10 <10 mg/dL    Comment: (NOTE) Lowest detectable limit for serum alcohol is 10 mg/dL.  For medical purposes only. Performed at [facility], [address] [place], [place] [address]   Urine rapid drug screen (hosp performed)     Status: Abnormal   Collection Time: 07/15/20  3:00 PM  Result Value Ref Range   Opiates NONE DETECTED NONE DETECTED   Cocaine NONE DETECTED NONE DETECTED   Benzodiazepines NONE DETECTED NONE DETECTED   Amphetamines NONE DETECTED NONE DETECTED   Tetrahydrocannabinol POSITIVE (A) NONE DETECTED    Barbiturates NONE DETECTED NONE DETECTED    Comment: (NOTE) DRUG SCREEN FOR MEDICAL PURPOSES ONLY.  IF CONFIRMATION IS NEEDED FOR ANY PURPOSE, NOTIFY LAB WITHIN 5 DAYS.  LOWEST DETECTABLE LIMITS FOR URINE DRUG SCREEN Drug Class                     Cutoff (ng/mL) Amphetamine and metabolites    1000 Barbiturate and metabolites    200 Benzodiazepine  200 Tricyclics and metabolites     300 Opiates and metabolites        300 Cocaine and metabolites        300 THC                            50 Performed at [facility], [address], [place], [place] [address]   CBC with Diff     Status: None   Collection Time: 07/15/20  3:00 PM  Result Value Ref Range   WBC 7.7 4.0 - 10.5 K/uL   RBC 4.48 3.87 - 5.11 MIL/uL   Hemoglobin 13.9 12.0 - 15.0 g/dL   HCT 79.1 50.5 - 69.7 %   MCV 93.8 80.0 - 100.0 fL   MCH 31.0 26.0 - 34.0 pg   MCHC 33.1 30.0 - 36.0 g/dL   RDW 94.8 01.6 - 55.3 %   Platelets 300 150 - 400 K/uL   nRBC 0.0 0.0 - 0.2 %   Neutrophils Relative % 61 %   Neutro Abs 4.7 1.7 - 7.7 K/uL   Lymphocytes Relative 29 %   Lymphs Abs 2.2 0.7 - 4.0 K/uL   Monocytes Relative 7 %   Monocytes Absolute 0.6 0.1 - 1.0 K/uL   Eosinophils Relative 2 %   Eosinophils Absolute 0.2 0.0 - 0.5 K/uL   Basophils Relative 1 %   Basophils Absolute 0.0 0.0 - 0.1 K/uL   Immature Granulocytes 0 %   Abs Immature Granulocytes 0.01 0.00 - 0.07 K/uL    Comment: Performed at [facility], [address], [place], [place] [address]  Resp Panel by RT-PCR (Flu A&B, Covid) Nasopharyngeal Swab     Status: None   Collection Time: 07/15/20  3:11 PM   Specimen: Nasopharyngeal Swab; Nasopharyngeal(NP) swabs in vial transport medium  Result Value Ref Range   SARS Coronavirus 2 by RT PCR NEGATIVE NEGATIVE    Comment: (NOTE) SARS-CoV-2 target nucleic acids are NOT DETECTED.  The SARS-CoV-2 RNA is generally detectable in upper respiratory specimens during the  acute phase of infection. The lowest concentration of SARS-CoV-2 viral copies this assay can detect is 138 copies/mL. A negative result does not preclude SARS-Cov-2 infection and should not be used as the sole basis for treatment or other patient management decisions. A negative result may occur with  improper specimen collection/handling, submission of specimen other than nasopharyngeal swab, presence of viral mutation(s) within the areas targeted by this assay, and inadequate number of viral copies(<138 copies/mL). A negative result must be combined with clinical observations, patient history, and epidemiological information. The expected result is Negative.  Fact Sheet for Patients:  BloggerCourse.com  Fact Sheet for Healthcare Providers:  SeriousBroker.it  This test is no t yet approved or cleared by the [place] FDA and  has been authorized for detection and/or diagnosis of SARS-CoV-2 by FDA under an Emergency Use Authorization (EUA). This EUA will remain  in effect (meaning this test can be used) for the duration of the COVID-19 declaration under Section 564(b)(1) of the Act, 21 U.S.C.section 360bbb-3(b)(1), unless the authorization is terminated  or revoked sooner.       Influenza A by PCR NEGATIVE NEGATIVE   Influenza B by PCR NEGATIVE NEGATIVE    Comment: (NOTE) The Xpert Xpress SARS-CoV-2/FLU/RSV plus assay is intended as an aid in the diagnosis of influenza from Nasopharyngeal swab specimens and should not be used as a sole basis for  treatment. Nasal washings and aspirates are unacceptable for Xpert Xpress SARS-CoV-2/FLU/RSV testing.  Fact Sheet for Patients: BloggerCourse.com  Fact Sheet for Healthcare Providers: SeriousBroker.it  This test is not yet approved or cleared by the [place] FDA and has been authorized for detection and/or diagnosis of  SARS-CoV-2 by FDA under an Emergency Use Authorization (EUA). This EUA will remain in effect (meaning this test can be used) for the duration of the COVID-19 declaration under Section 564(b)(1) of the Act, 21 U.S.C. section 360bbb-3(b)(1), unless the authorization is terminated or revoked.  Performed at [facility], [address], [place], [place] [address]   I-Stat beta hCG blood, ED     Status: None   Collection Time: 07/15/20  3:16 PM  Result Value Ref Range   I-stat hCG, quantitative <5.0 <5 mIU/mL   Comment 3            Comment:   GEST. AGE      CONC.  (mIU/mL)   <=1 WEEK        5 - 50     2 WEEKS       50 - 500     3 WEEKS       100 - 10,000     4 WEEKS     1,000 - 30,000        FEMALE AND NON-PREGNANT FEMALE:     LESS THAN 5 mIU/mL     Current Facility-Administered Medications  Medication Dose Route Frequency Provider Last Rate Last Admin  . baclofen (LIORESAL) tablet 10 mg  10 mg Oral TID PRN Gerhard Munch, MD      . diphenhydrAMINE (BENADRYL) capsule 50 mg  50 mg Oral QHS PRN Charlynne Pander, MD   50 mg at 07/16/20 0503  . escitalopram (LEXAPRO) tablet 10 mg  10 mg Oral QHS Gerhard Munch, MD      . hydrocortisone cream 1 %   Topical BID PRN Charlynne Pander, MD   Given at 07/16/20 [phone]  . hydrOXYzine (ATARAX/VISTARIL) tablet 25 mg  25 mg Oral BID PRN Gerhard Munch, MD      . ibuprofen (ADVIL) tablet 800 mg  800 mg Oral Daily PRN Gerhard Munch, MD      . lamoTRIgine (LAMICTAL) tablet 25 mg  25 mg Oral QHS Gerhard Munch, MD      . risperiDONE (RISPERDAL M-TABS) disintegrating tablet 2 mg  2 mg Oral Q8H PRN Gerhard Munch, MD       And  . LORazepam (ATIVAN) tablet 1 mg  1 mg Oral PRN Gerhard Munch, MD       And  . ziprasidone (GEODON) injection 20 mg  20 mg Intramuscular PRN Gerhard Munch, MD       Current Outpatient Medications  Medication Sig Dispense Refill  . atenolol (TENORMIN) 25 MG tablet Take 1 tablet (25 mg  total) by mouth daily. (Patient taking differently: Take 12.5-25 mg by mouth daily.) 30 tablet 0  . baclofen (LIORESAL) 10 MG tablet Take 10 mg by mouth 3 (three) times daily as needed for muscle spasms.     . diclofenac (VOLTAREN) 75 MG EC tablet Take 75 mg by mouth 2 (two) times daily as needed for mild pain.    . diphenhydrAMINE (BENADRYL) 25 MG tablet Take 25 mg by mouth every 6 (six) hours as needed for itching.    . escitalopram (LEXAPRO) 10 MG tablet TAKE 1 TABLET (10 MG TOTAL) BY MOUTH AT BEDTIME. (Patient taking  differently: Take 15 mg by mouth daily.) 30 tablet 0  . hydrocortisone 2.5 % cream Apply 1 application topically daily as needed (itching).    . Magnesium Oxide, Antacid, 500 MG CAPS Take 500 mg by mouth daily.    Marland Kitchen NYAMYC powder Apply 1 application topically daily as needed (itching).    . Omega-3 Fatty Acids (FISH OIL) 1000 MG CAPS Take 1 capsule by mouth 2 (two) times daily.    . Turmeric 450 MG CAPS Take 1 capsule by mouth daily. 450-50 mg capsule    . amoxicillin-clavulanate (AUGMENTIN) 875-125 MG tablet Take 1 tablet by mouth every 12 (twelve) hours. (Patient not taking: Reported on 07/15/2020) 14 tablet 0  . cetirizine (ZYRTEC) 10 MG tablet Take 1 tablet (10 mg total) by mouth daily. (Patient not taking: No sig reported) 30 tablet 0  . fluticasone (FLONASE) 50 MCG/ACT nasal spray Place 1 spray into both nostrils daily. (Patient not taking: No sig reported) 16 g 2  . lamoTRIgine (LAMICTAL) 25 MG tablet TAKE 1 TABLET (25 MG TOTAL) BY MOUTH AT BEDTIME. (Patient not taking: Reported on 07/15/2020) 30 tablet 0  . meclizine (ANTIVERT) 25 MG tablet Take 1 tablet (25 mg total) by mouth 3 (three) times daily as needed for dizziness. (Patient not taking: Reported on 07/15/2020) 30 tablet 0  . methocarbamol (ROBAXIN) 500 MG tablet Take 1 tablet (500 mg total) by mouth 2 (two) times daily. (Patient not taking: Reported on 07/15/2020) 20 tablet 0  . naproxen (NAPROSYN) 375 MG tablet Take 1  tablet (375 mg total) by mouth 2 (two) times daily. (Patient not taking: No sig reported) 20 tablet 0  . ondansetron (ZOFRAN) 4 MG tablet Take 1 tablet (4 mg total) by mouth every 8 (eight) hours as needed for up to 10 doses for nausea or vomiting. (Patient not taking: Reported on 07/15/2020) 10 tablet 0  . triamcinolone cream (KENALOG) 0.1 % Apply 1 application topically 2 (two) times daily. (Patient not taking: No sig reported) 30 g 0    Musculoskeletal: Strength & Muscle Tone: within normal limits Gait & Station: normal Patient leans: N/A  Psychiatric Specialty Exam:  Presentation  General Appearance: Fairly Groomed  Eye Contact:Good  Speech:Clear and Coherent; Normal Rate  Speech Volume:Increased (increased at times)  Handedness:Right   Mood and Affect  Mood:Anxious  Affect:Congruent   Thought Process  Thought Processes:Coherent  Descriptions of Associations:Intact  Orientation:Full (Time, Place and Person)  Thought Content:Logical  History of Schizophrenia/Schizoaffective disorder:No  Duration of Psychotic Symptoms:No data recorded Hallucinations:Hallucinations: None  Ideas of Reference:None  Suicidal Thoughts:Suicidal Thoughts: No  Homicidal Thoughts:Homicidal Thoughts: No   Sensorium  Memory:No data recorded Judgment:Fair  Insight:Fair   Executive Functions  Concentration:Good  Attention Span:Good  Recall:Good  Fund of Knowledge:Good  Language:Good   Psychomotor Activity  Psychomotor Activity:Psychomotor Activity: Normal   Assets  Assets:Communication Skills; Desire for Improvement; Financial Resources/Insurance; Physical Health; Resilience   Sleep  Sleep:Sleep: Good Number of Hours of Sleep: 7   Physical Exam: Physical Exam Vitals reviewed.  HENT:     Head: Normocephalic.     Right Ear: Tympanic membrane normal.     Left Ear: Tympanic membrane normal.     Nose: Nose normal.     Mouth/Throat:     Mouth: Mucous membranes  are dry.     Pharynx: Oropharynx is clear.  Eyes:     Conjunctiva/sclera: Conjunctivae normal.  Cardiovascular:     Rate and Rhythm: Normal rate.     Pulses:  Normal pulses.  Pulmonary:     Effort: Pulmonary effort is normal.  Abdominal:     Tenderness: There is no guarding.  Musculoskeletal:        General: Normal range of motion.     Cervical back: Normal range of motion.  Skin:    General: Skin is warm and dry.     Capillary Refill: Capillary refill takes less than 2 seconds.  Neurological:     Mental Status: She is alert and oriented to person, place, and time.  Psychiatric:        Attention and Perception: Attention normal.        Mood and Affect: Mood is anxious.        Speech: Speech normal.        Behavior: Behavior is not agitated or aggressive. Behavior is cooperative.        Thought Content: Thought content is not paranoid or delusional. Thought content does not include homicidal or suicidal ideation. Thought content does not include homicidal or suicidal plan.        Cognition and Memory: Cognition normal.        Judgment: Judgment is impulsive.    Review of Systems  Constitutional: Negative.   HENT: Negative.   Eyes: Negative.   Respiratory: Negative.   Cardiovascular: Negative.   Gastrointestinal: Negative.   Genitourinary: Negative.   Musculoskeletal: Negative.   Skin: Negative.   Neurological: Negative.   Endo/Heme/Allergies: Negative.   Psychiatric/Behavioral: Negative for depression. The patient is nervous/anxious.    Blood pressure 121/79, pulse 66, temperature 97.8 F (36.6 C), temperature source Oral, resp. rate 17, SpO2 99 %. There is no height or weight on file to calculate BMI.  Treatment Plan Summary:  Recommended patient call [facility] make an appointment as soon as possible to address her housing concerns.   SW will provide resources for homeless shelters . suicidal ideation  Continue current at home psychotropic medications and follow up with  [facility] provider for medication management.   The suicide prevention education provided includes the following:  Suicide risk factors  Suicide prevention and interventions  National Suicide Hotline telephone number  [facility] assessment telephone number  [facility] Emergency Assistance 911  [facility] and/or Residential Mobile Crisis Unit telephone number   Disposition: No evidence of imminent risk to self or others at present.   Patient does not meet criteria for psychiatric inpatient admission. Supportive therapy provided about ongoing stressors. Discussed crisis plan, support from social network, calling 911, coming to the Emergency Department, and calling Suicide Hotline.  Ardis Hughs, NP 07/16/2020 11:38 AM

## 2020-07-16 NOTE — Progress Notes (Signed)
.   Transition of Care [facility]) - Emergency Department Mini Assessment   Patient Details  Name: Jasmin Berry MRN: [id number] Date of Birth: [birth year]  [facility]) CM/SW Contact:    Elliot Cousin, RN Phone Number: [phone] 07/16/2020, 1:22 PM   Clinical Narrative:  TOC CM spoke to pt via phone as she had left, texted pt list of shelters. States she is familiar with [employer]. States will call different shelters for placement. States she has to out of her apt by May 1, and her court date is May 4. Pt gave permission to create referral for [facility]. Referral sent to [facility] for assistance with housing.    ED Mini Assessment: What brought you to the Emergency Department? : depression  Barriers to Discharge: No Barriers Identified  Barrier interventions: provided shelter list  Means of departure: Taxi  Interventions which prevented an admission or readmission: [facility] Referral for Community Resources,Homeless Screening    Patient Contact and Communications    Admission diagnosis:  Mental Health Check Patient Active Problem List   Diagnosis Date Noted  . Suicidal ideations 07/16/2020  . Depression 01/09/2020  . Bipolar disorder (HCC) 01/09/2020   PCP:  Dartha Lodge, FNP Pharmacy:   [facility] [address] [place] - [address] [place] [address] Phone: [phone] Fax: [phone]

## 2020-07-16 NOTE — Discharge Instructions (Signed)
For your behavioral health needs you are advised to continue treatment with [facility]:       [facility]      [address]      [place], [place] [address]       [phone]

## 2020-07-16 NOTE — ED Notes (Signed)
Patient took home supply of omega 3 fish oil 1000mg . Per MD

## 2020-07-22 ENCOUNTER — Encounter (HOSPITAL_COMMUNITY): Payer: Self-pay

## 2020-07-22 ENCOUNTER — Emergency Department (HOSPITAL_COMMUNITY)
Admission: EM | Admit: 2020-07-22 | Discharge: 2020-07-23 | Disposition: A | Discharge status: Home / Self Care | Payer: Federal, State, Local not specified - Other | Attending: Student | Admitting: Student

## 2020-07-22 ENCOUNTER — Other Ambulatory Visit: Payer: Self-pay

## 2020-07-22 DIAGNOSIS — E876 Hypokalemia: Secondary | ICD-10-CM | POA: Insufficient documentation

## 2020-07-22 DIAGNOSIS — R197 Diarrhea, unspecified: Secondary | ICD-10-CM | POA: Insufficient documentation

## 2020-07-22 DIAGNOSIS — R350 Frequency of micturition: Secondary | ICD-10-CM | POA: Insufficient documentation

## 2020-07-22 DIAGNOSIS — F17210 Nicotine dependence, cigarettes, uncomplicated: Secondary | ICD-10-CM | POA: Insufficient documentation

## 2020-07-22 DIAGNOSIS — D649 Anemia, unspecified: Secondary | ICD-10-CM | POA: Insufficient documentation

## 2020-07-22 DIAGNOSIS — I10 Essential (primary) hypertension: Secondary | ICD-10-CM | POA: Insufficient documentation

## 2020-07-22 DIAGNOSIS — R45851 Suicidal ideations: Secondary | ICD-10-CM

## 2020-07-22 DIAGNOSIS — Z79899 Other long term (current) drug therapy: Secondary | ICD-10-CM | POA: Insufficient documentation

## 2020-07-22 DIAGNOSIS — Z20822 Contact with and (suspected) exposure to covid-19: Secondary | ICD-10-CM | POA: Insufficient documentation

## 2020-07-22 DIAGNOSIS — F99 Mental disorder, not otherwise specified: Secondary | ICD-10-CM

## 2020-07-22 DIAGNOSIS — R45850 Homicidal ideations: Secondary | ICD-10-CM | POA: Insufficient documentation

## 2020-07-22 LAB — RAPID URINE DRUG SCREEN, HOSP PERFORMED
Amphetamines: NOT DETECTED
Barbiturates: NOT DETECTED
Benzodiazepines: NOT DETECTED
Cocaine: NOT DETECTED
Opiates: NOT DETECTED
Tetrahydrocannabinol: POSITIVE — AB

## 2020-07-22 LAB — COMPREHENSIVE METABOLIC PANEL
ALT: 13 U/L (ref 0–44)
AST: 21 U/L (ref 15–41)
Albumin: 2.7 g/dL — ABNORMAL LOW (ref 3.5–5.0)
Alkaline Phosphatase: 59 U/L (ref 38–126)
Anion gap: 4 — ABNORMAL LOW (ref 5–15)
BUN: 11 mg/dL (ref 6–20)
CO2: 20 mmol/L — ABNORMAL LOW (ref 22–32)
Calcium: 6.6 mg/dL — ABNORMAL LOW (ref 8.9–10.3)
Chloride: 122 mmol/L — ABNORMAL HIGH (ref 98–111)
Creatinine, Ser: 0.5 mg/dL (ref 0.44–1.00)
GFR, Estimated: >60 mL/min (ref >60)
Glucose, Bld: 63 mg/dL — ABNORMAL LOW (ref 70–99)
Potassium: 3.3 mmol/L — ABNORMAL LOW (ref 3.5–5.1)
Sodium: 146 mmol/L — ABNORMAL HIGH (ref 135–145)
Total Bilirubin: 0.6 mg/dL (ref 0.3–1.2)
Total Protein: 4.8 g/dL — ABNORMAL LOW (ref 6.5–8.1)

## 2020-07-22 LAB — URINALYSIS, ROUTINE W REFLEX MICROSCOPIC
Bilirubin Urine: NEGATIVE
Glucose, UA: NEGATIVE mg/dL
Hgb urine dipstick: NEGATIVE
Ketones, ur: NEGATIVE mg/dL
Leukocytes,Ua: NEGATIVE
Nitrite: NEGATIVE
Protein, ur: NEGATIVE mg/dL
Specific Gravity, Urine: 1.005 (ref 1.005–1.030)
pH: 6 (ref 5.0–8.0)

## 2020-07-22 LAB — CBC WITH DIFFERENTIAL/PLATELET
Abs Immature Granulocytes: 0.02 10*3/uL (ref 0.00–0.07)
Basophils Absolute: 0 10*3/uL (ref 0.0–0.1)
Basophils Relative: 1 %
Eosinophils Absolute: 0.2 10*3/uL (ref 0.0–0.5)
Eosinophils Relative: 3 %
HCT: 34.2 % — ABNORMAL LOW (ref 36.0–46.0)
Hemoglobin: 11.4 g/dL — ABNORMAL LOW (ref 12.0–15.0)
Immature Granulocytes: 0 %
Lymphocytes Relative: 29 %
Lymphs Abs: 2.3 10*3/uL (ref 0.7–4.0)
MCH: 31.8 pg (ref 26.0–34.0)
MCHC: 33.3 g/dL (ref 30.0–36.0)
MCV: 95.3 fL (ref 80.0–100.0)
Monocytes Absolute: 0.6 10*3/uL (ref 0.1–1.0)
Monocytes Relative: 8 %
Neutro Abs: 4.8 10*3/uL (ref 1.7–7.7)
Neutrophils Relative %: 59 %
Platelets: 245 10*3/uL (ref 150–400)
RBC: 3.59 MIL/uL — ABNORMAL LOW (ref 3.87–5.11)
RDW: 13.1 % (ref 11.5–15.5)
WBC: 7.9 10*3/uL (ref 4.0–10.5)
nRBC: 0 % (ref 0.0–0.2)

## 2020-07-22 LAB — RESP PANEL BY RT-PCR (FLU A&B, COVID) ARPGX2
Influenza A by PCR: NEGATIVE
Influenza B by PCR: NEGATIVE
SARS Coronavirus 2 by RT PCR: NEGATIVE

## 2020-07-22 LAB — I-STAT BETA HCG BLOOD, ED (MC, WL, AP ONLY): I-stat hCG, quantitative: <5 m[IU]/mL (ref <5)

## 2020-07-22 NOTE — ED Notes (Signed)
Pt stated to Clinical research associate upon her arrival that she knew how this works and is going to say she will kill herself so she can come here to stay a while. MD aware.

## 2020-07-22 NOTE — ED Provider Notes (Signed)
[facility]-EMERGENCY DEPT Provider Note   CSN: [id number] Arrival date & time: 07/22/20  1448     History No chief complaint on file.   Jasmin Berry is a 55 y.o. female who presents  and EMS for "crisis".  Patient states he has not slept in 90 hours.  She is not living in her home shelter anymore she did not like living there.  She states that she is suicidal ideation with with intent to either shoot herself or overdose on her medications.  Additionally endorses homicidal ideation to "the people who have harmed me".  But does not have a plan for homicide.  Of note, patient told the nurse that she "know how it works around here" and states that she is endorsing suicidal ideation in order to be able to stay in the ER.  She does endorse urinary frequency and watery diarrhea today.  States that someone poisoned food with Betadine".  I personally reviewed this patient's medical records.  History of depression and bipolar disorder.    HPI     Past Medical History:  Diagnosis Date  . Hypertension   . PTSD (post-traumatic stress disorder)   . Renal disorder   . Vertigo     Patient Active Problem List   Diagnosis Date Noted  . Suicidal ideations 07/16/2020  . Depression 01/09/2020  . Bipolar disorder (HCC) 01/09/2020    History reviewed. No pertinent surgical history.   OB History   No obstetric history on file.     No family history on file.  Social History   Tobacco Use  . Smoking status: Current Every Day Smoker    Packs/day: 1.00    Types: Cigarettes  . Smokeless tobacco: Never Used  Vaping Use  . Vaping Use: Every day  Substance Use Topics  . Alcohol use: No  . Drug use: Yes    Comment: CBD    Home Medications Prior to Admission medications   Medication Sig Start Date End Date Taking? Authorizing Provider  amoxicillin-clavulanate (AUGMENTIN) 875-125 MG tablet Take 1 tablet by mouth every 12 (twelve) hours. Patient not taking:  Reported on 07/15/2020 06/29/19   Couture, Cortni S, PA-C  atenolol (TENORMIN) 25 MG tablet Take 1 tablet (25 mg total) by mouth daily. Patient taking differently: Take 12.5-25 mg by mouth daily. 08/23/17   Aviva Kluver B, PA-C  baclofen (LIORESAL) 10 MG tablet Take 10 mg by mouth 3 (three) times daily as needed for muscle spasms.  05/24/19   [provider]  cetirizine (ZYRTEC) 10 MG tablet Take 1 tablet (10 mg total) by mouth daily. Patient not taking: No sig reported 08/23/17 09/22/17  Aviva Kluver B, PA-C  diclofenac (VOLTAREN) 75 MG EC tablet Take 75 mg by mouth 2 (two) times daily as needed for mild pain.    [provider]  diphenhydrAMINE (BENADRYL) 25 MG tablet Take 25 mg by mouth every 6 (six) hours as needed for itching.    [provider]  escitalopram (LEXAPRO) 10 MG tablet TAKE 1 TABLET (10 MG TOTAL) BY MOUTH AT BEDTIME. Patient taking differently: Take 15 mg by mouth daily. 01/09/20 01/08/21  Nwoko, Tommas Olp, PA  fluticasone (FLONASE) 50 MCG/ACT nasal spray Place 1 spray into both nostrils daily. Patient not taking: No sig reported 08/23/17   Aviva Kluver B, PA-C  hydrocortisone 2.5 % cream Apply 1 application topically daily as needed (itching). 06/03/20   [provider]  lamoTRIgine (LAMICTAL) 25 MG tablet TAKE 1 TABLET (  25 MG TOTAL) BY MOUTH AT BEDTIME. Patient not taking: Reported on 07/15/2020 01/09/20 01/08/21  Karel Jarvis E, PA  Magnesium Oxide, Antacid, 500 MG CAPS Take 500 mg by mouth daily. 05/24/19   [provider]  meclizine (ANTIVERT) 25 MG tablet Take 1 tablet (25 mg total) by mouth 3 (three) times daily as needed for dizziness. Patient not taking: Reported on 07/15/2020 06/12/19   Devoria Albe, MD  methocarbamol (ROBAXIN) 500 MG tablet Take 1 tablet (500 mg total) by mouth 2 (two) times daily. Patient not taking: Reported on 07/15/2020 08/14/19   Harolyn Rutherford C, PA-C  naproxen (NAPROSYN) 375 MG tablet Take 1 tablet (375 mg total) by  mouth 2 (two) times daily. Patient not taking: No sig reported 08/23/17   Aviva Kluver B, PA-C  [facility] powder Apply 1 application topically daily as needed (itching). 05/01/20   [provider]  Omega-3 Fatty Acids (FISH OIL) 1000 MG CAPS Take 1 capsule by mouth 2 (two) times daily. 05/01/20   [provider]  ondansetron (ZOFRAN) 4 MG tablet Take 1 tablet (4 mg total) by mouth every 8 (eight) hours as needed for up to 10 doses for nausea or vomiting. Patient not taking: Reported on 07/15/2020 05/25/20   Virgina Norfolk, DO  triamcinolone cream (KENALOG) 0.1 % Apply 1 application topically 2 (two) times daily. Patient not taking: No sig reported 01/14/18   Dietrich Pates, PA-C  Turmeric 450 MG CAPS Take 1 capsule by mouth daily. 450-50 mg capsule    [provider]    Allergies    Sulfa antibiotics and Yeast-related products  Review of Systems   Review of Systems  Constitutional: Negative.   HENT: Negative.   Eyes: Negative.   Respiratory: Negative.   Gastrointestinal: Positive for diarrhea. Negative for abdominal pain, anal bleeding, blood in stool, constipation, nausea and vomiting.  Genitourinary: Positive for frequency and urgency. Negative for decreased urine volume, difficulty urinating, dyspareunia, dysuria, enuresis, flank pain, hematuria, vaginal bleeding, vaginal discharge and vaginal pain.  Musculoskeletal: Negative.   Neurological: Negative.   Psychiatric/Behavioral: Positive for agitation, sleep disturbance and suicidal ideas.    Physical Exam Updated Vital Signs BP 101/72 (BP Location: Left Arm)   Pulse 72   Temp 98.6 F (37 C) (Oral)   Resp 16   SpO2 99%   Physical Exam Vitals and nursing note reviewed.  Constitutional:      Appearance: She is not ill-appearing or toxic-appearing.  HENT:     Head: Normocephalic and atraumatic.     Nose: Nose normal.     Mouth/Throat:     Mouth: Mucous membranes are moist.     Pharynx: Oropharynx is clear.  Uvula midline. No oropharyngeal exudate, posterior oropharyngeal erythema or uvula swelling.     Tonsils: No tonsillar exudate.  Eyes:     General: Lids are normal. Vision grossly intact.        Right eye: No discharge.        Left eye: No discharge.     Extraocular Movements: Extraocular movements intact.     Conjunctiva/sclera: Conjunctivae normal.     Pupils: Pupils are equal, round, and reactive to light.  Neck:     Trachea: Trachea and phonation normal.  Cardiovascular:     Rate and Rhythm: Normal rate and regular rhythm.     Pulses: Normal pulses.     Heart sounds: Normal heart sounds. No murmur heard.   Pulmonary:     Effort: Pulmonary effort is normal.  No tachypnea, bradypnea, accessory muscle usage or respiratory distress.     Breath sounds: Normal breath sounds. No wheezing or rales.  Chest:     Chest wall: No mass, lacerations, deformity, swelling, tenderness, crepitus or edema.  Abdominal:     General: Bowel sounds are normal. There is no distension.     Tenderness: There is abdominal tenderness in the suprapubic area. There is no right CVA tenderness, left CVA tenderness, guarding or rebound.  Musculoskeletal:        General: No deformity.     Cervical back: Normal range of motion and neck supple. No rigidity or crepitus. No pain with movement, spinous process tenderness or muscular tenderness.     Right lower leg: No edema.     Left lower leg: No edema.  Lymphadenopathy:     Cervical: No cervical adenopathy.  Skin:    General: Skin is warm and dry.  Neurological:     General: No focal deficit present.     Mental Status: She is alert. Mental status is at baseline.  Psychiatric:        Mood and Affect: Mood normal.        Speech: Speech is rapid and pressured and tangential.        Thought Content: Thought content is delusional. Thought content includes homicidal and suicidal ideation. Thought content includes suicidal plan. Thought content does not include  homicidal plan.     ED Results / Procedures / Treatments   Labs (all labs ordered are listed, but only abnormal results are displayed) Labs Reviewed  COMPREHENSIVE METABOLIC PANEL - Abnormal; Notable for the following components:      Result Value   Sodium 146 (*)    Potassium 3.3 (*)    Chloride 122 (*)    CO2 20 (*)    Glucose, Bld 63 (*)    Calcium 6.6 (*)    Total Protein 4.8 (*)    Albumin 2.7 (*)    Anion gap 4 (*)    All other components within normal limits  RAPID URINE DRUG SCREEN, HOSP PERFORMED - Abnormal; Notable for the following components:   Tetrahydrocannabinol POSITIVE (*)    All other components within normal limits  CBC WITH DIFFERENTIAL/PLATELET - Abnormal; Notable for the following components:   RBC 3.59 (*)    Hemoglobin 11.4 (*)    HCT 34.2 (*)    All other components within normal limits  URINALYSIS, ROUTINE W REFLEX MICROSCOPIC - Abnormal; Notable for the following components:   Color, Urine STRAW (*)    All other components within normal limits  RESP PANEL BY RT-PCR (FLU A&B, COVID) ARPGX2  ETHANOL  ACETAMINOPHEN LEVEL  SALICYLATE LEVEL  I-STAT BETA HCG BLOOD, ED ([facility], [facility], [facility] ONLY)    EKG EKG: normal sinus rhythm, no STEMI.  Radiology No results found.  Procedures Procedures   Medications Ordered in ED Medications - No data to display  ED Course  I have reviewed the triage vital signs and the nursing notes.  Pertinent labs & imaging results that were available during my care of the patient were reviewed by me and considered in my medical decision making (see chart for details).    MDM Rules/Calculators/A&P                         55 year old female who presents via EMS for erratic behavior and "crisis".  Endorses SI and HI.  Vital signs are normal on intake.  Cardiopulmonary exam is normal, abdominal exam is benign.  Patient is neurologically intact.  Continues to endorse SI with plan to shoot herself (no access to guns) or overdose  on prescribed medications. Passive HI. We will proceed with medical clearance.  CBC unremarkable with baseline anemia with hemoglobin of 11.  CMP with mild hypokalemia of 3.3.  UA as per past panel negative.  Patient is medically cleared at this time.  Awaiting TTS evaluation.  Patient provided food and is resting comfortably in hallway bed. Patient remains voluntary in the ED at this time.  We will hold off on IVC as patient did not inform RN that she "knows how this works" and informed RN that she was going to endorse suicidality in order to remain in the emergency department longer though she is not truly suicidal.  Given patient did endorse suicidal ideation with plan to this provider, will proceed with psychiatric evaluation, however do not feel this patient requires involuntary commitment at this time.  This chart was dictated using voice recognition software, Dragon. Despite the best efforts of this provider to proofread and correct errors, errors may still occur which can change documentation meaning.  Final Clinical Impression(s) / ED Diagnoses Final diagnoses:  None    Rx / DC Orders ED Discharge Orders    None       Sherrilee GillesSponseller, Eldridge Marcott R, PA-C 07/22/20 2146    Charlynne PanderYao, David Hsienta, MD 07/22/20 2242

## 2020-07-22 NOTE — ED Triage Notes (Signed)
Per [place], [place] due to behaviors. Crisis hasnt slept in 90 hours. EMS gave 5mg  Haldol, 500 N/S,  20G L hand. Recently lost housing arrangement and cat.   140/90 90 HR 98 RA 18 RR

## 2020-07-23 ENCOUNTER — Other Ambulatory Visit: Payer: Self-pay

## 2020-07-23 ENCOUNTER — Encounter (HOSPITAL_COMMUNITY): Payer: Self-pay | Admitting: Psychiatry

## 2020-07-23 ENCOUNTER — Inpatient Hospital Stay (HOSPITAL_COMMUNITY)
Admission: AD | Admit: 2020-07-23 | Discharge: 2020-07-28 | DRG: 885 | Disposition: A | Discharge status: Home / Self Care | Payer: Federal, State, Local not specified - Other | Source: Intra-hospital | Attending: Psychiatry | Admitting: Psychiatry

## 2020-07-23 DIAGNOSIS — Z882 Allergy status to sulfonamides status: Secondary | ICD-10-CM | POA: Diagnosis not present

## 2020-07-23 DIAGNOSIS — Z79899 Other long term (current) drug therapy: Secondary | ICD-10-CM

## 2020-07-23 DIAGNOSIS — M62838 Other muscle spasm: Secondary | ICD-10-CM | POA: Diagnosis present

## 2020-07-23 DIAGNOSIS — F4310 Post-traumatic stress disorder, unspecified: Secondary | ICD-10-CM | POA: Diagnosis present

## 2020-07-23 DIAGNOSIS — R45851 Suicidal ideations: Secondary | ICD-10-CM

## 2020-07-23 DIAGNOSIS — Z5900 Homelessness unspecified: Secondary | ICD-10-CM | POA: Diagnosis not present

## 2020-07-23 DIAGNOSIS — Z560 Unemployment, unspecified: Secondary | ICD-10-CM | POA: Diagnosis not present

## 2020-07-23 DIAGNOSIS — F410 Panic disorder [episodic paroxysmal anxiety] without agoraphobia: Secondary | ICD-10-CM | POA: Diagnosis present

## 2020-07-23 DIAGNOSIS — F332 Major depressive disorder, recurrent severe without psychotic features: Secondary | ICD-10-CM | POA: Diagnosis present

## 2020-07-23 DIAGNOSIS — Z9151 Personal history of suicidal behavior: Secondary | ICD-10-CM

## 2020-07-23 DIAGNOSIS — Z7289 Other problems related to lifestyle: Secondary | ICD-10-CM

## 2020-07-23 DIAGNOSIS — Z9109 Other allergy status, other than to drugs and biological substances: Secondary | ICD-10-CM | POA: Diagnosis not present

## 2020-07-23 DIAGNOSIS — N390 Urinary tract infection, site not specified: Secondary | ICD-10-CM | POA: Diagnosis present

## 2020-07-23 DIAGNOSIS — F603 Borderline personality disorder: Secondary | ICD-10-CM | POA: Diagnosis present

## 2020-07-23 DIAGNOSIS — F17290 Nicotine dependence, other tobacco product, uncomplicated: Secondary | ICD-10-CM | POA: Diagnosis present

## 2020-07-23 DIAGNOSIS — F1290 Cannabis use, unspecified, uncomplicated: Secondary | ICD-10-CM | POA: Diagnosis present

## 2020-07-23 DIAGNOSIS — I10 Essential (primary) hypertension: Secondary | ICD-10-CM | POA: Diagnosis present

## 2020-07-23 DIAGNOSIS — F17210 Nicotine dependence, cigarettes, uncomplicated: Secondary | ICD-10-CM | POA: Diagnosis present

## 2020-07-23 MED ORDER — TRAZODONE HCL 50 MG PO TABS
50.0000 mg | ORAL_TABLET | Freq: Every evening | ORAL | Status: DC | PRN
  Administered 2020-07-23: 50 mg via ORAL
  Filled 2020-07-23: qty 1

## 2020-07-23 MED ORDER — BACLOFEN 10 MG PO TABS
10.0000 mg | ORAL_TABLET | Freq: Three times a day (TID) | ORAL | Status: DC | PRN
  Administered 2020-07-26 – 2020-07-27 (×3): 10 mg via ORAL
  Filled 2020-07-23 (×3): qty 1

## 2020-07-23 MED ORDER — ACETAMINOPHEN 325 MG PO TABS: 650.0000 mg | ORAL_TABLET | Freq: Four times a day (QID) | ORAL | Status: DC | PRN

## 2020-07-23 MED ORDER — LORAZEPAM 1 MG PO TABS: 1.0000 mg | ORAL_TABLET | ORAL | Status: DC | PRN

## 2020-07-23 MED ORDER — ATENOLOL 25 MG PO TABS
25.0000 mg | ORAL_TABLET | Freq: Every day | ORAL | Status: DC
  Administered 2020-07-23: 25 mg via ORAL
  Filled 2020-07-23 (×2): qty 1

## 2020-07-23 MED ORDER — ATENOLOL 25 MG PO TABS
25.0000 mg | ORAL_TABLET | Freq: Every day | ORAL | Status: DC
  Administered 2020-07-24 – 2020-07-28 (×5): 25 mg via ORAL
  Filled 2020-07-23: qty 1
  Filled 2020-07-23: qty 7
  Filled 2020-07-23 (×5): qty 1

## 2020-07-23 MED ORDER — ESCITALOPRAM OXALATE 10 MG PO TABS: 10.0000 mg | ORAL_TABLET | Freq: Every day | ORAL | Status: DC

## 2020-07-23 MED ORDER — AMOXICILLIN-POT CLAVULANATE 875-125 MG PO TABS
1.0000 | ORAL_TABLET | Freq: Two times a day (BID) | ORAL | Status: DC
  Administered 2020-07-23 (×2): 1 via ORAL
  Filled 2020-07-23 (×2): qty 1

## 2020-07-23 MED ORDER — OLANZAPINE 5 MG PO TBDP
5.0000 mg | ORAL_TABLET | Freq: Three times a day (TID) | ORAL | Status: DC | PRN
  Administered 2020-07-24: 5 mg via ORAL
  Filled 2020-07-23: qty 1

## 2020-07-23 MED ORDER — ALUM & MAG HYDROXIDE-SIMETH 200-200-20 MG/5ML PO SUSP
30.0000 mL | ORAL | Status: DC | PRN
  Administered 2020-07-27: 30 mL via ORAL
  Filled 2020-07-23 (×3): qty 30

## 2020-07-23 MED ORDER — LAMOTRIGINE 25 MG PO TABS: 25.0000 mg | ORAL_TABLET | Freq: Every day | ORAL | Status: DC

## 2020-07-23 MED ORDER — AMOXICILLIN-POT CLAVULANATE 875-125 MG PO TABS
1.0000 | ORAL_TABLET | Freq: Two times a day (BID) | ORAL | Status: DC
  Administered 2020-07-24 – 2020-07-28 (×8): 1 via ORAL
  Filled 2020-07-23 (×7): qty 1
  Filled 2020-07-23: qty 5
  Filled 2020-07-23: qty 1
  Filled 2020-07-23: qty 5
  Filled 2020-07-23 (×3): qty 1

## 2020-07-23 MED ORDER — ZIPRASIDONE MESYLATE 20 MG IM SOLR: 20.0000 mg | INTRAMUSCULAR | Status: DC | PRN

## 2020-07-23 MED ORDER — LAMOTRIGINE 25 MG PO TABS
25.0000 mg | ORAL_TABLET | Freq: Every day | ORAL | Status: DC
  Administered 2020-07-23: 25 mg via ORAL
  Filled 2020-07-23 (×2): qty 1

## 2020-07-23 MED ORDER — ESCITALOPRAM OXALATE 10 MG PO TABS
10.0000 mg | ORAL_TABLET | Freq: Every day | ORAL | Status: DC
  Administered 2020-07-23: 10 mg via ORAL
  Filled 2020-07-23 (×2): qty 1

## 2020-07-23 MED ORDER — MAGNESIUM HYDROXIDE 400 MG/5ML PO SUSP: 30.0000 mL | Freq: Every day | ORAL | Status: DC | PRN

## 2020-07-23 MED ORDER — BACLOFEN 10 MG PO TABS: 10.0000 mg | ORAL_TABLET | Freq: Three times a day (TID) | ORAL | Status: DC | PRN

## 2020-07-23 NOTE — ED Notes (Signed)
Patient again resting in bed with eyes closed at this time

## 2020-07-23 NOTE — Tx Team (Signed)
Initial Treatment Plan 07/23/2020 11:46 PM Jasmin Berry PPH:[id number]    PATIENT STRESSORS: Financial difficulties Loss of cat   PATIENT STRENGTHS: Active sense of humor Capable of independent living Motivation for treatment/growth   PATIENT IDENTIFIED PROBLEMS: Suicidal Ideation  (overall wellbeing and coping mechanisms)                   DISCHARGE CRITERIA:  Improved stabilization in mood, thinking, and/or behavior Reduction of life-threatening or endangering symptoms to within safe limits Verbal commitment to aftercare and medication compliance  PRELIMINARY DISCHARGE PLAN: Outpatient therapy Placement in alternative living arrangements  PATIENT/FAMILY INVOLVEMENT: This treatment plan has been presented to and reviewed with the patient, Jasmin Berry, and/or family member.  The patient and family have been given the opportunity to ask questions and make suggestions.  Mancel Bale, RN 07/23/2020, 11:46 PM

## 2020-07-23 NOTE — BH Assessment (Signed)
[facility] Assessment Progress Note  Per Vernard Gambles, NP, this pt requires psychiatric hospitalization at this time.  Brook, RN, [facility] has assigned pt to [facility] Rm [id number] to the service of Landry Mellow, MD.  [facility] will be ready to receive pt at 20:00.  Pt has signed Voluntary Admission and Consent for Treatment, as well as Consent to Release Information to [facility], and a notification call has been placed.  Signed forms have been faxed to [facility].  EDP Melene Plan, DO and pt's nurse, Myia, have been notified, and Myia agrees to send original paperwork along with pt via [employer], and to call report to [phone].  Doylene Canning, [place] [facility] Coordinator [phone]

## 2020-07-23 NOTE — ED Notes (Signed)
TTS again to bedside to talk to patient

## 2020-07-23 NOTE — ED Notes (Signed)
Patient given breakfast tray at this time.

## 2020-07-23 NOTE — ED Notes (Signed)
Patient again resting with eyes closed at this time

## 2020-07-23 NOTE — ED Notes (Signed)
Patient ambulated to and from bathroom at this time, stable gait 

## 2020-07-23 NOTE — ED Notes (Signed)
Patient voicing suicidal ideation at this time

## 2020-07-23 NOTE — ED Notes (Signed)
Lunch tray provided to patient at this time.

## 2020-07-23 NOTE — ED Notes (Signed)
Patient back to resting in bed with eyes closed at this time

## 2020-07-23 NOTE — BH Assessment (Signed)
TTS reassessed this patient this morning. Patient endorsed SI w/ Plan to OD under a tree with every medication in her possession if she is discharged . Patient denied HI/ AVH but advised writer that she smoked THC on yesterday.  Patient was clam and cooperative during assessment. Per Vernard Gambles , NP patient is recommended for inpatient.   See Provider Note Below: States she lost her apartment one week ago and had to surrender her cat. Reports since she has been at [facility] her depression is worse. States she has ran out of options, "I got no where to go".  Reports she has a long history of trauma. Several suicide attempts, but would not elaborate. She grew up in foster homes. She has no family and no support system.  [facility] services of [employer] in place. Does have housing arranged at [facility] and left voluntarily. Reports mariajuana use. Home medications: Atenolol, Lexapro, Benadryl, Voltaren. States she has not been taking the Lamictal, could not remember how long it has been.  Disposition: Recommend psychiatric Inpatient admission when medically cleared.   Ardis Hughs, NP 07/23/2020 9:28 AM

## 2020-07-23 NOTE — ED Notes (Signed)
Visitor to bedside at this time. 

## 2020-07-23 NOTE — Consult Note (Signed)
[place] Face-to-Face Psychiatry Consult   Reason for Consult:  Psychiatric assessment related to suicidal ideations. Referring Physician:  Dr. Marianna Fuss Patient Identification: Jasmin Berry MRN:  [id number] Principal Diagnosis: Suicidal ideations Diagnosis:  Principal Problem:   Suicidal ideations  Total Time spent with patient: 30 minutes  Subjective:   Suzetta Berry is a 55 y.o. female patient admitted with   Jasmin Berry, 55 y.o., female patient seen face to face  by this provider, consulted with Dr. Lucianne Muss; and chart reviewed on 07/23/20.  On evaluation Timmy Cleverly reports her thoughts of suicide have worsened over the past week.  HPI:   During evaluation Josclyn Rosales is in sitting position in no acute distress.  She is alert, oriented x 4 and cooperative. She reports her mood as anxious and depressed with congruent affect. She does not appear to be responding to internal/external stimuli or delusional thoughts. Patient endorses suicidal ideations. States these thoughts have worsened over the past week. Reports if she leaves the hospital she will, "go to a tree sit down and take all my pills and never wake up". States she does have access to pills, such as atenolol, lexapro and benadryl. Endorses homicidal ideations towards [facility]. States she is so angry they sent her to the [facility]. She states "that place is not fit for dogs". States she has no plan or means, she just has the thoughts. Reports she is impulsive especially when she gets angry. Denies psychosis and paranoia. Reprots her appetite and sleep are decreased. Patient answered question appropriately.  States she lost her apartment one week ago and had to surrender her cat. Reports since she has been at [facility] her depression is worse. States she has ran out of options, "I got no where to go".   Reports she has a long history of trauma. Several suicide attempts, but would not elaborate. She grew up in foster  homes. She has no family and no support system.   [facility] in place. Does have [facility] arranged at [facility] and left voluntarily.   Reports mariajuana use. Home medications: Atenolol, Lexapro, Benadryl, Voltaren. States she has not been taking the Lamictal, could not remember how long it has been.   Past Psychiatric History: PTSD, Bipolar, BPD  Risk to Self:  yes Risk to Others:  yes Prior Inpatient Therapy:  yes Prior Outpatient Therapy:  yes  Past Medical History:  Past Medical History:  Diagnosis Date  . Hypertension   . PTSD (post-traumatic stress disorder)   . Renal disorder   . Vertigo    History reviewed. No pertinent surgical history. Family History: No family history on file. Family Psychiatric  History: unkown Social History:  Social History   Substance and Sexual Activity  Alcohol Use No     Social History   Substance and Sexual Activity  Drug Use Yes   Comment: CBD    Social History   Socioeconomic History  . Marital status: Single    Spouse name: Not on file  . Number of children: Not on file  . Years of education: Not on file  . Highest education level: Not on file  Occupational History  . Not on file  Tobacco Use  . Smoking status: Current Every Day Smoker    Packs/day: 1.00    Types: Cigarettes  . Smokeless tobacco: Never Used  Vaping Use  . Vaping Use: Every day  Substance and Sexual Activity  . Alcohol use: No  .  Drug use: Yes    Comment: CBD  . Sexual activity: Not on file  Other Topics Concern  . Not on file  Social History Narrative  . Not on file   Social Determinants of Health   Financial Resource Strain: Not on file  Food Insecurity: Not on file  Transportation Needs: Not on file  Physical Activity: Not on file  Stress: Not on file  Social Connections: Not on file   Additional Social History:    Allergies:   Allergies  Allergen Reactions  . Sulfa Antibiotics Rash  . Yeast-Related Products  Rash    rash    Labs:  Results for orders placed or performed during the hospital encounter of 07/22/20 (from the past 48 hour(s))  Urine rapid drug screen (hosp performed)     Status: Abnormal   Collection Time: 07/22/20  4:20 PM  Result Value Ref Range   Opiates NONE DETECTED NONE DETECTED   Cocaine NONE DETECTED NONE DETECTED   Benzodiazepines NONE DETECTED NONE DETECTED   Amphetamines NONE DETECTED NONE DETECTED   Tetrahydrocannabinol POSITIVE (A) NONE DETECTED   Barbiturates NONE DETECTED NONE DETECTED    Comment: (NOTE) DRUG SCREEN FOR MEDICAL PURPOSES ONLY.  IF CONFIRMATION IS NEEDED FOR ANY PURPOSE, NOTIFY LAB WITHIN 5 DAYS.  LOWEST DETECTABLE LIMITS FOR URINE DRUG SCREEN Drug Class                     Cutoff (ng/mL) Amphetamine and metabolites    1000 Barbiturate and metabolites    200 Benzodiazepine                 200 Tricyclics and metabolites     300 Opiates and metabolites        300 Cocaine and metabolites        300 THC                            50 Performed at [facility], [address], [place], [place] [address]   Urinalysis, Routine w reflex microscopic Urine, Clean Catch     Status: Abnormal   Collection Time: 07/22/20  4:21 PM  Result Value Ref Range   Color, Urine STRAW (A) YELLOW   APPearance CLEAR CLEAR   Specific Gravity, Urine 1.005 1.005 - 1.030   pH 6.0 5.0 - 8.0   Glucose, UA NEGATIVE NEGATIVE mg/dL   Hgb urine dipstick NEGATIVE NEGATIVE   Bilirubin Urine NEGATIVE NEGATIVE   Ketones, ur NEGATIVE NEGATIVE mg/dL   Protein, ur NEGATIVE NEGATIVE mg/dL   Nitrite NEGATIVE NEGATIVE   Leukocytes,Ua NEGATIVE NEGATIVE    Comment: Performed at [facility], [address] [place], [place] [address]  Comprehensive metabolic panel     Status: Abnormal   Collection Time: 07/22/20  5:18 PM  Result Value Ref Range   Sodium 146 (H) 135 - 145 mmol/L   Potassium 3.3 (L) 3.5 - 5.1 mmol/L   Chloride 122 (H) 98 -  111 mmol/L   CO2 20 (L) 22 - 32 mmol/L   Glucose, Bld 63 (L) 70 - 99 mg/dL    Comment: Glucose reference range applies only to samples taken after fasting for at least 8 hours.   BUN 11 6 - 20 mg/dL   Creatinine, Ser 0.98 0.44 - 1.00 mg/dL   Calcium 6.6 (L) 8.9 - 10.3 mg/dL   Total Protein 4.8 (L) 6.5 - 8.1 g/dL  Albumin 2.7 (L) 3.5 - 5.0 g/dL   AST 21 15 - 41 U/L   ALT 13 0 - 44 U/L   Alkaline Phosphatase 59 38 - 126 U/L   Total Bilirubin 0.6 0.3 - 1.2 mg/dL   GFR, Estimated >16 >10 mL/min    Comment: (NOTE) Calculated using the CKD-EPI Creatinine Equation (2021)    Anion gap 4 (L) 5 - 15    Comment: Performed at [facility], [address], [place], [place] [address]  CBC with Diff     Status: Abnormal   Collection Time: 07/22/20  5:18 PM  Result Value Ref Range   WBC 7.9 4.0 - 10.5 K/uL   RBC 3.59 (L) 3.87 - 5.11 MIL/uL   Hemoglobin 11.4 (L) 12.0 - 15.0 g/dL   HCT 40.9 (L) 81.1 - 91.4 %   MCV 95.3 80.0 - 100.0 fL   MCH 31.8 26.0 - 34.0 pg   MCHC 33.3 30.0 - 36.0 g/dL   RDW 78.2 95.6 - 21.3 %   Platelets 245 150 - 400 K/uL   nRBC 0.0 0.0 - 0.2 %   Neutrophils Relative % 59 %   Neutro Abs 4.8 1.7 - 7.7 K/uL   Lymphocytes Relative 29 %   Lymphs Abs 2.3 0.7 - 4.0 K/uL   Monocytes Relative 8 %   Monocytes Absolute 0.6 0.1 - 1.0 K/uL   Eosinophils Relative 3 %   Eosinophils Absolute 0.2 0.0 - 0.5 K/uL   Basophils Relative 1 %   Basophils Absolute 0.0 0.0 - 0.1 K/uL   Immature Granulocytes 0 %   Abs Immature Granulocytes 0.02 0.00 - 0.07 K/uL    Comment: Performed at [facility], [address], [place], [place] [address]  I-Stat beta hCG blood, ED     Status: None   Collection Time: 07/22/20  5:26 PM  Result Value Ref Range   I-stat hCG, quantitative <5.0 <5 mIU/mL   Comment 3            Comment:   GEST. AGE      CONC.  (mIU/mL)   <=1 WEEK        5 - 50     2 WEEKS       50 - 500     3 WEEKS       100 - 10,000     4 WEEKS      1,000 - 30,000        FEMALE AND NON-PREGNANT FEMALE:     LESS THAN 5 mIU/mL   Resp Panel by RT-PCR (Flu A&B, Covid) Nasopharyngeal Swab     Status: None   Collection Time: 07/22/20  5:57 PM   Specimen: Nasopharyngeal Swab; Nasopharyngeal(NP) swabs in vial transport medium  Result Value Ref Range   SARS Coronavirus 2 by RT PCR NEGATIVE NEGATIVE    Comment: (NOTE) SARS-CoV-2 target nucleic acids are NOT DETECTED.  The SARS-CoV-2 RNA is generally detectable in upper respiratory specimens during the acute phase of infection. The lowest concentration of SARS-CoV-2 viral copies this assay can detect is 138 copies/mL. A negative result does not preclude SARS-Cov-2 infection and should not be used as the sole basis for treatment or other patient management decisions. A negative result may occur with  improper specimen collection/handling, submission of specimen other than nasopharyngeal swab, presence of viral mutation(s) within the areas targeted by this assay, and inadequate number of viral copies(<138 copies/mL). A negative result must be combined with  clinical observations, patient history, and epidemiological information. The expected result is Negative.  Fact Sheet for Patients:  BloggerCourse.com  Fact Sheet for Healthcare Providers:  SeriousBroker.it  This test is no t yet approved or cleared by the [place] FDA and  has been authorized for detection and/or diagnosis of SARS-CoV-2 by FDA under an Emergency Use Authorization (EUA). This EUA will remain  in effect (meaning this test can be used) for the duration of the COVID-19 declaration under Section 564(b)(1) of the Act, 21 U.S.C.section 360bbb-3(b)(1), unless the authorization is terminated  or revoked sooner.       Influenza A by PCR NEGATIVE NEGATIVE   Influenza B by PCR NEGATIVE NEGATIVE    Comment: (NOTE) The Xpert Xpress SARS-CoV-2/FLU/RSV plus assay is intended  as an aid in the diagnosis of influenza from Nasopharyngeal swab specimens and should not be used as a sole basis for treatment. Nasal washings and aspirates are unacceptable for Xpert Xpress SARS-CoV-2/FLU/RSV testing.  Fact Sheet for Patients: BloggerCourse.com  Fact Sheet for Healthcare Providers: SeriousBroker.it  This test is not yet approved or cleared by the [place] FDA and has been authorized for detection and/or diagnosis of SARS-CoV-2 by FDA under an Emergency Use Authorization (EUA). This EUA will remain in effect (meaning this test can be used) for the duration of the COVID-19 declaration under Section 564(b)(1) of the Act, 21 U.S.C. section 360bbb-3(b)(1), unless the authorization is terminated or revoked.  Performed at [facility], [address], [place], [place] [address]     Current Facility-Administered Medications  Medication Dose Route Frequency Provider Last Rate Last Admin  . amoxicillin-clavulanate (AUGMENTIN) 875-125 MG per tablet 1 tablet  1 tablet Oral Q12H Rancour, Jeannett Senior, MD   1 tablet at 07/23/20 0509  . atenolol (TENORMIN) tablet 25 mg  25 mg Oral Daily Rancour, Stephen, MD      . baclofen (LIORESAL) tablet 10 mg  10 mg Oral TID PRN Rancour, Jeannett Senior, MD      . escitalopram (LEXAPRO) tablet 10 mg  10 mg Oral QHS Rancour, Stephen, MD      . lamoTRIgine (LAMICTAL) tablet 25 mg  25 mg Oral QHS Rancour, Stephen, MD       Current Outpatient Medications  Medication Sig Dispense Refill  . amoxicillin-clavulanate (AUGMENTIN) 875-125 MG tablet Take 1 tablet by mouth every 12 (twelve) hours. (Patient not taking: Reported on 07/15/2020) 14 tablet 0  . atenolol (TENORMIN) 25 MG tablet Take 1 tablet (25 mg total) by mouth daily. (Patient taking differently: Take 12.5-25 mg by mouth daily.) 30 tablet 0  . baclofen (LIORESAL) 10 MG tablet Take 10 mg by mouth 3 (three) times daily as needed  for muscle spasms.     . cetirizine (ZYRTEC) 10 MG tablet Take 1 tablet (10 mg total) by mouth daily. (Patient not taking: No sig reported) 30 tablet 0  . diclofenac (VOLTAREN) 75 MG EC tablet Take 75 mg by mouth 2 (two) times daily as needed for mild pain.    . diphenhydrAMINE (BENADRYL) 25 MG tablet Take 25 mg by mouth every 6 (six) hours as needed for itching.    . escitalopram (LEXAPRO) 10 MG tablet TAKE 1 TABLET (10 MG TOTAL) BY MOUTH AT BEDTIME. (Patient taking differently: Take 15 mg by mouth daily.) 30 tablet 0  . fluticasone (FLONASE) 50 MCG/ACT nasal spray Place 1 spray into both nostrils daily. (Patient not taking: No sig reported) 16 g 2  . hydrocortisone 2.5 % cream Apply  1 application topically daily as needed (itching).    Marland Kitchen. lamoTRIgine (LAMICTAL) 25 MG tablet TAKE 1 TABLET (25 MG TOTAL) BY MOUTH AT BEDTIME. (Patient not taking: Reported on 07/15/2020) 30 tablet 0  . Magnesium Oxide, Antacid, 500 MG CAPS Take 500 mg by mouth daily.    . meclizine (ANTIVERT) 25 MG tablet Take 1 tablet (25 mg total) by mouth 3 (three) times daily as needed for dizziness. (Patient not taking: Reported on 07/15/2020) 30 tablet 0  . methocarbamol (ROBAXIN) 500 MG tablet Take 1 tablet (500 mg total) by mouth 2 (two) times daily. (Patient not taking: Reported on 07/15/2020) 20 tablet 0  . naproxen (NAPROSYN) 375 MG tablet Take 1 tablet (375 mg total) by mouth 2 (two) times daily. (Patient not taking: No sig reported) 20 tablet 0  . NYAMYC powder Apply 1 application topically daily as needed (itching).    . Omega-3 Fatty Acids (FISH OIL) 1000 MG CAPS Take 1 capsule by mouth 2 (two) times daily.    . ondansetron (ZOFRAN) 4 MG tablet Take 1 tablet (4 mg total) by mouth every 8 (eight) hours as needed for up to 10 doses for nausea or vomiting. (Patient not taking: Reported on 07/15/2020) 10 tablet 0  . triamcinolone cream (KENALOG) 0.1 % Apply 1 application topically 2 (two) times daily. (Patient not taking: No sig  reported) 30 g 0  . Turmeric 450 MG CAPS Take 1 capsule by mouth daily. 450-50 mg capsule      Musculoskeletal: Strength & Muscle Tone: within normal limits Gait & Station: normal Patient leans: N/A   Psychiatric Specialty Exam:  Presentation  General Appearance: Disheveled  Eye Contact:Good  Speech:Clear and Coherent; Normal Rate  Speech Volume:Normal  Handedness:Right   Mood and Affect  Mood:Anxious; Depressed  Affect:Congruent   Thought Process  Thought Processes:Coherent  Descriptions of Associations:Intact  Orientation:Full (Time, Place and Person)  Thought Content:Scattered  History of Schizophrenia/Schizoaffective disorder:No  Duration of Psychotic Symptoms:No data recorded Hallucinations:Hallucinations: None  Ideas of Reference:None  Suicidal Thoughts:Suicidal Thoughts: Yes, Active SI Active Intent and/or Plan: With Intent; With Plan; With Access to Means  Homicidal Thoughts:Homicidal Thoughts: Yes, Active HI Active Intent and/or Plan: Without Plan; Without Intent   Sensorium  Memory:Immediate Good; Recent Good; Remote Good  Judgment:Poor  Insight:Fair   Executive Functions  Concentration:Fair  Attention Span:Fair  Recall:Fair; Good  Fund of Knowledge:Good  Language:Good   Psychomotor Activity  Psychomotor Activity:Psychomotor Activity: Normal   Assets  Assets:Communication Skills; Desire for Improvement; Financial Resources/Insurance; Physical Health; Resilience; Social Support   Sleep  Sleep:Sleep: Fair   Physical Exam: Physical Exam Vitals reviewed.  Constitutional:      Appearance: Normal appearance.  HENT:     Head: Normocephalic.     Right Ear: Tympanic membrane normal.     Left Ear: Tympanic membrane normal.     Nose: Nose normal.     Mouth/Throat:     Mouth: Mucous membranes are dry.  Eyes:     Conjunctiva/sclera: Conjunctivae normal.  Cardiovascular:     Rate and Rhythm: Normal rate.     Pulses:  Normal pulses.  Pulmonary:     Effort: Pulmonary effort is normal.  Abdominal:     Tenderness: There is no guarding.  Musculoskeletal:        General: Normal range of motion.     Cervical back: Normal range of motion.  Skin:    General: Skin is warm and dry.     Capillary Refill: Capillary  refill takes less than 2 seconds.  Neurological:     Mental Status: She is alert and oriented to person, place, and time.  Psychiatric:        Attention and Perception: Attention normal.        Mood and Affect: Mood is anxious and depressed.        Speech: Speech normal.        Behavior: Behavior is not agitated, slowed, aggressive, withdrawn, hyperactive or combative. Behavior is cooperative.        Thought Content: Thought content includes homicidal and suicidal ideation.        Cognition and Memory: Cognition normal.        Judgment: Judgment is impulsive.    Review of Systems  Constitutional: Negative.   HENT: Negative.   Eyes: Negative.   Respiratory: Negative.   Cardiovascular: Negative.   Gastrointestinal: Negative.   Genitourinary: Negative.   Musculoskeletal: Negative.   Neurological: Negative.   Endo/Heme/Allergies: Negative.   Psychiatric/Behavioral: Positive for depression and suicidal ideas. The patient is nervous/anxious.    Blood pressure (!) 124/56, pulse 68, temperature 97.8 F (36.6 C), temperature source Oral, resp. rate 18, SpO2 100 %. There is no height or weight on file to calculate BMI.  Treatment Plan Summary: Daily contact with patient by psychiatric provider/TTS assess and evaluate symptoms and progress in treatment.  Disposition: Recommend psychiatric Inpatient admission when medically cleared.   Ardis Hughs, NP 07/23/2020 9:28 AM

## 2020-07-23 NOTE — ED Notes (Signed)
BH counselor at bedside 

## 2020-07-23 NOTE — ED Notes (Signed)
Patient ambulated to bathroom at this time, stable gait 
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
## 2020-07-23 NOTE — ED Notes (Signed)
Pt is requesting her lexapro that is not due until this evening. Pt was told this information and began to yell at this RN. Pt redirected to her bed and given reassurance. Sitter present.
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
## 2020-07-23 NOTE — ED Notes (Signed)
TTS to bedside at this time to speak with patient

## 2020-07-23 NOTE — ED Provider Notes (Signed)
Emergency Medicine Observation Re-evaluation Note  Jasmin Berry is a 55 y.o. female, seen on rounds today.  Pt initially presented to the ED for complaints of No chief complaint on file. Currently, the patient is resting.  Physical Exam  BP 127/73   Pulse 73   Temp 98.9 F (37.2 C) (Oral)   Resp 18   SpO2 98%  Physical Exam General: calm, resting in bed Cardiac: warm and well perfused Lungs: even and unlabored Psych: calm  ED Course / MDM  EKG:   I have reviewed the labs performed to date as well as medications administered while in observation.  Recent changes in the last 24 hours include TTS rec psych eval in am.  Plan  Current plan is for psych eval in am. Patient is not under full IVC at this time.   Milagros Loll, MD 07/23/20 [phone]

## 2020-07-23 NOTE — ED Notes (Signed)
Patient resting with eyes closed at this time, will continue to monitor.

## 2020-07-23 NOTE — ED Notes (Signed)
Patient again resting in bed with eyes closed, will continue to monitor

## 2020-07-23 NOTE — ED Notes (Signed)
Patient resting in bed with eyes closed, will continue to monitor.

## 2020-07-23 NOTE — ED Notes (Signed)
Safe transport contacted to transport patient to [facility].

## 2020-07-24 DIAGNOSIS — F332 Major depressive disorder, recurrent severe without psychotic features: Principal | ICD-10-CM

## 2020-07-24 LAB — LIPID PANEL
Cholesterol: 248 mg/dL — ABNORMAL HIGH (ref 0–200)
HDL: 65 mg/dL (ref >40)
LDL Cholesterol: 153 mg/dL — ABNORMAL HIGH (ref 0–99)
Total CHOL/HDL Ratio: 3.8 RATIO
Triglycerides: 150 mg/dL — ABNORMAL HIGH (ref <150)
VLDL: 30 mg/dL (ref 0–40)

## 2020-07-24 LAB — HEMOGLOBIN A1C
Hgb A1c MFr Bld: 5.5 % (ref 4.8–5.6)
Mean Plasma Glucose: 111.15 mg/dL

## 2020-07-24 LAB — TSH: TSH: 2.323 u[IU]/mL (ref 0.350–4.500)

## 2020-07-24 MED ORDER — HYDROXYZINE HCL 10 MG PO TABS
10.0000 mg | ORAL_TABLET | Freq: Three times a day (TID) | ORAL | Status: DC | PRN
  Administered 2020-07-24 – 2020-07-27 (×5): 10 mg via ORAL
  Filled 2020-07-24: qty 10
  Filled 2020-07-24 (×5): qty 1

## 2020-07-24 MED ORDER — ESCITALOPRAM OXALATE 5 MG PO TABS
15.0000 mg | ORAL_TABLET | Freq: Every day | ORAL | Status: DC
  Filled 2020-07-24: qty 3

## 2020-07-24 MED ORDER — ENSURE ENLIVE PO LIQD
237.0000 mL | Freq: Two times a day (BID) | ORAL | Status: DC
  Administered 2020-07-25: 237 mL via ORAL
  Filled 2020-07-24 (×12): qty 237

## 2020-07-24 MED ORDER — TRAZODONE HCL 50 MG PO TABS
50.0000 mg | ORAL_TABLET | Freq: Every evening | ORAL | Status: DC | PRN
  Administered 2020-07-25 – 2020-07-27 (×3): 50 mg via ORAL
  Filled 2020-07-24: qty 7
  Filled 2020-07-24 (×3): qty 1

## 2020-07-24 MED ORDER — ARIPIPRAZOLE 2 MG PO TABS
2.0000 mg | ORAL_TABLET | Freq: Every day | ORAL | Status: DC
  Administered 2020-07-24 – 2020-07-28 (×5): 2 mg via ORAL
  Filled 2020-07-24: qty 7
  Filled 2020-07-24 (×7): qty 1

## 2020-07-24 MED ORDER — ADULT MULTIVITAMIN W/MINERALS CH
1.0000 | ORAL_TABLET | Freq: Every day | ORAL | Status: DC
  Administered 2020-07-24 – 2020-07-28 (×5): 1 via ORAL
  Filled 2020-07-24 (×8): qty 1

## 2020-07-24 MED ORDER — ESCITALOPRAM OXALATE 5 MG PO TABS
15.0000 mg | ORAL_TABLET | Freq: Every day | ORAL | Status: DC
  Filled 2020-07-24 (×2): qty 3

## 2020-07-24 MED ORDER — ESCITALOPRAM OXALATE 5 MG PO TABS
15.0000 mg | ORAL_TABLET | Freq: Every day | ORAL | Status: DC
  Administered 2020-07-25 – 2020-07-28 (×4): 15 mg via ORAL
  Filled 2020-07-24: qty 21
  Filled 2020-07-24 (×5): qty 3

## 2020-07-24 NOTE — Progress Notes (Signed)
   07/24/20 0500  Sleep  Number of Hours 4.75

## 2020-07-24 NOTE — Progress Notes (Signed)
NUTRITION ASSESSMENT  RD consulted for nutritional assessment.  INTERVENTION: 1. Supplement: Ensure Enlive po BID, each supplement provides 350 kcal and 20 grams of protein 2. Multivitamin with minerals daily  NUTRITION DIAGNOSIS: Unintentional weight loss related to sub-optimal intake as evidenced by pt report.   Goal: Pt to meet >/= 90% of their estimated nutrition needs.  Monitor:  PO intake  Assessment:  Pt admitted for MDD and SI. Pt reporting decreased appetite but denies weight loss. Per weight records, no weight loss noted. Noted that pt is currently homeless. Will order Ensure while appetite is decreased.   Height: Ht Readings from Last 1 Encounters:  07/23/20 4\' 10"  (1.473 m)    Weight: Wt Readings from Last 1 Encounters:  07/23/20 47.2 kg    Weight Hx: Wt Readings from Last 10 Encounters:  07/23/20 47.2 kg  01/09/20 45.4 kg  06/11/19 44.9 kg  03/08/18 41.3 kg  02/02/16 43.1 kg    BMI:  Body mass index is 21.74 kg/m. Pt meets criteria for normal based on current BMI.  Estimated Nutritional Needs: Kcal: 25-30 kcal/kg Protein: > 1 gram protein/kg Fluid: 1 ml/kcal  Diet Order:  Diet Order            Diet regular Room service appropriate? Yes; Fluid consistency: Thin  Diet effective now                Pt is also offered choice of unit snacks mid-morning and mid-afternoon.    Lab results and medications reviewed.   02/04/16, MS, RD, LDN Inpatient Clinical Dietitian Contact information available via Amion
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
## 2020-07-24 NOTE — Progress Notes (Signed)
Pt observed with crying spells this evening, reported being depressed with feelings of "wanting to hurt myself again. I was fine earlier but everything is coming back, I don't have my kitty cat here with me and I don't want to bother you all". Prior to event pt had a bright affect, animated but anxious, talkative and hyperactive with peers on interactions earlier this shift. PRN Vistaril 10 mg PO given at 1628. Pt reports relief at 1725 when reassessed. Denies further self harm thoughts. Verbally contracts for safety. Q 15 minutes checks maintained.

## 2020-07-24 NOTE — Progress Notes (Signed)
Psychoeducational Group Note  Date:  07/24/2020 Time: 2015 Group Topic/Focus:  wrap up group  Participation Level: Did Not Attend  Participation Quality:  Not Applicable  Affect:  Not Applicable  Cognitive:  Not Applicable  Insight:  Not Applicable  Engagement in Group: Not Applicable  Additional Comments:  Did not attend.   Johann Capers S 07/24/2020, 9:04 PM
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
## 2020-07-25 LAB — COMPREHENSIVE METABOLIC PANEL
ALT: 19 U/L (ref 0–44)
AST: 22 U/L (ref 15–41)
Albumin: 3.9 g/dL (ref 3.5–5.0)
Alkaline Phosphatase: 79 U/L (ref 38–126)
Anion gap: 9 (ref 5–15)
BUN: 14 mg/dL (ref 6–20)
CO2: 25 mmol/L (ref 22–32)
Calcium: 8.9 mg/dL (ref 8.9–10.3)
Chloride: 108 mmol/L (ref 98–111)
Creatinine, Ser: 0.81 mg/dL (ref 0.44–1.00)
GFR, Estimated: >60 mL/min (ref >60)
Glucose, Bld: 117 mg/dL — ABNORMAL HIGH (ref 70–99)
Potassium: 3.9 mmol/L (ref 3.5–5.1)
Sodium: 142 mmol/L (ref 135–145)
Total Bilirubin: 0.6 mg/dL (ref 0.3–1.2)
Total Protein: 7.3 g/dL (ref 6.5–8.1)

## 2020-07-25 NOTE — [facility] Group Notes (Signed)
LCSW Aftercare Discharge Planning Group Note  07/25/2020  Type of Group and Topic: Psychoeducational Group: Discharge Planning  Participation Level: Active  Description of Group  Discharge planning group reviews patient's anticipated discharge plans and assists patients to anticipate and address any barriers to wellness/recovery in the community. Suicide prevention education is reviewed with patients in group.  Therapeutic Goals  1. Patients will state their anticipated discharge plan and mental health aftercare 2. Patients will identify potential barriers to wellness in the community setting 3. Patients will engage in problem solving, solution focused discussion of ways to anticipate and address barriers to wellness/recovery  Summary of Patient Progress: Pt was attentive during group and shared appropriately.    Therapeutic Modalities: Motivational Interviewing  Callie Bunyard, LCSWA Clinicial Social Worker [facility] 

## 2020-07-25 NOTE — Progress Notes (Signed)
   07/25/20 0619  Vital Signs  Temp 98 F (36.7 C)  Temp Source Oral  Pulse Rate 86  Pulse Rate Source Monitor  Resp 18  BP 118/73  BP Location Left Arm  BP Method Automatic  Patient Position (if appropriate) Standing  Oxygen Therapy  SpO2 100 %   D: Patient admits to SI with a plan to OD on pills. Pt. Admits to HI, but no one here. Pt. Denies AVH. Pt rated both anxiety and depression 9/10. Pt. Out in open areas, social with peers and staff.  A:  Patient took scheduled medicine.  Support and encouragement provided Routine safety checks conducted every 15 minutes. Patient  Informed to notify staff with any concerns.   R: Safety maintained.

## 2020-07-25 NOTE — Progress Notes (Signed)
Recreation Therapy Notes  Date:  5.6.22 Time: 0930 Location: [address] Dayroom  Group Topic: Stress Management  Goal Area(s) Addresses:  Patient will identify positive stress management techniques. Patient will identify benefits of using stress management post d/c.  Behavioral Response: Engaged  Intervention: Stress Management  Activity:  Meditation.  LRT played a meditation that focused on deep concentration.  Patients were to listen to the meditation and follow along as it played to focus on breathing and concentrate as much as possible.    Education:  Stress Management, Discharge Planning.   Education Outcome: Acknowledges Education  Clinical Observations/Feedback:  Pt attended and participated in group.   Ayrton Mcvay, LRT/CTRS    Brandyn Lowrey A 07/25/2020 10:59 AM 

## 2020-07-25 NOTE — Progress Notes (Signed)
[facility]  07/25/2020 3:24 PM Raphael GibneyLisa Haq  MRN:  [id number]   Reason for admission: Patient is a 55 year old homeless female with a reported history of prior diagnoses of depression, borderline personality disorder, PTSD and bipolar disorder, history of multiple prior past suicide attempts, history of nonsuicidal self-injurious behavior who was admitted for worsening symptoms of depression, suicidal ideation and thoughts of harming others following the eviction from her apartment and the loss of her emotional support animal.    Subjective: "I need to be started on fish oil for my cholesterol and cortisone cream for my body yeast."  Objective: Medical record reviewed.  Patient case discussed in detail with members of the treatment team.  Patient was seen and interviewed today with team social worker present in the patient's room.  The patient stated that she is still "very angry and very sad" and continues to have thoughts of "wanting to hurt myself by taking pills."  The patient states she is also having thoughts about hurting her [facility] team and her landlord "violently because I am very angry."  The patient states that she is upset at the loss of her housing and the lack of help she is received from her [facility] team in obtaining other housing.  She states she spoke with a friend with whom she may be able to stay briefly but is interested in housing resources.  Team social worker and I discussed with patient that her stay here would be short-term and that we would not be able to find her an apartment and she most likely would be discharged to shelter unless is a friend with whom she can stay.  Social worker provided patient with housing resources. I encouraged patient to use the list provided by social work and to start to make phone calls today to places she is interested in going after discharge since her stay here will be brief.  Stated understanding and stated she would make calls today.  She reports  that she is tolerating the increase in Lexapro and the initiation of Abilify well without side effects.  She reported no other complaints.  On interview today she is neatly dressed and groomed wearing her own clothes.  She maintains good eye contact.  She is polite and cooperative.  Speech is hyperverbal but not pressured.  Scribes her mood as very angry and very sad, however she presents as essentially euthymic although angry about stressors that have resulted in her lost housing.  No psychotic content elicited.  She does not appear to attend or respond to internal stimuli and does not report perceptual abnormalities.  She is alert and oriented.  Attention and concentration are fair.  Insight is fair.  Judgment is poor.  The patient's primary motivation for the current admission appears to be to avoid homelessness.  She slept adequately last night and has been eating.  She did not attend groups but did attend groups today appropriately.  Labs today include CMP which revealed glucose of 117 and was otherwise WNL.  Lipid panel drawn yesterday revealed total cholesterol of 248, LDL of 153 and triglycerides of 150 and otherwise WNL.  Hemoglobin A1c was 5.5.  TSH was 2.323.  Signs this morning include BP 138/76 sitting and 118/73 standing, pulse of 76 sitting and 86 standing, O2 sat of 100% and temperature of 98.3.  Principal Problem: Major depressive disorder, recurrent severe without psychotic features (HCC) Diagnosis: Principal Problem:   Major depressive disorder, recurrent severe without psychotic features (HCC)  Active Problems:   Suicidal ideations  Total Time spent with patient: 20 minutes  Past Psychiatric History: Patient states that she has made between 7 and 9 prior suicide attempts during her life most of which have been by overdose at times in combination with alcohol.  She also reports a history of nonsuicidal self-injurious behavior of scratching herself or superficial cutting or picking at  scabs in order to calm herself down.  She denies any history of prior inpatient psychiatric admissions.  She states that she had a trial of lamotrigine in the past and has unknown side effects and it was discontinued.  She took Zoloft and Paxil in the past which made her feel more aggressive.   Past Medical History:  Past Medical History:  Diagnosis Date  . Hypertension   . PTSD (post-traumatic stress disorder)   . Renal disorder   . Vertigo    History reviewed. No pertinent surgical history. Family History: History reviewed. No pertinent family history. Family Psychiatric  History: Patient states that her mother had mental health issues and was hospitalized at [facility].  She knows no other information about her family history.   Social History:  Social History   Substance and Sexual Activity  Alcohol Use No     Social History   Substance and Sexual Activity  Drug Use Yes   Comment: CBD    Social History   Socioeconomic History  . Marital status: Single    Spouse name: Not on file  . Number of children: Not on file  . Years of education: Not on file  . Highest education level: Not on file  Occupational History  . Not on file  Tobacco Use  . Smoking status: Current Every Day Smoker    Packs/day: 1.00    Types: Cigarettes  . Smokeless tobacco: Never Used  Vaping Use  . Vaping Use: Every day  Substance and Sexual Activity  . Alcohol use: No  . Drug use: Yes    Comment: CBD  . Sexual activity: Not on file  Other Topics Concern  . Not on file  Social History Narrative  . Not on file   Social Determinants of Health   Financial Resource Strain: Not on file  Food Insecurity: Not on file  Transportation Needs: Not on file  Physical Activity: Not on file  Stress: Not on file  Social Connections: Not on file   Additional Social History:                         Sleep: Good  Appetite:  Good  Current Medications: Current Facility-Administered Medications   Medication Dose Route Frequency Provider Last Rate Last Admin  . acetaminophen (TYLENOL) tablet 650 mg  650 mg Oral Q6H PRN Ardis Hughs, NP      . alum & mag hydroxide-simeth (MAALOX/MYLANTA) 200-200-20 MG/5ML suspension 30 mL  30 mL Oral Q4H PRN Ardis Hughs, NP      . amoxicillin-clavulanate (AUGMENTIN) 875-125 MG per tablet 1 tablet  1 tablet Oral Q12H Ardis Hughs, NP   1 tablet at 07/25/20 [phone]  . ARIPiprazole (ABILIFY) tablet 2 mg  2 mg Oral Daily Claudie Revering, MD   2 mg at 07/25/20 2878  . atenolol (TENORMIN) tablet 25 mg  25 mg Oral Daily Ardis Hughs, NP   25 mg at 07/25/20 6767  . baclofen (LIORESAL) tablet 10 mg  10 mg Oral TID PRN Ardis Hughs, NP      .  escitalopram (LEXAPRO) tablet 15 mg  15 mg Oral Daily Antonieta Pert, MD   15 mg at 07/25/20 1610  . feeding supplement (ENSURE ENLIVE / ENSURE PLUS) liquid 237 mL  237 mL Oral BID BM Antonieta Pert, MD   237 mL at 07/25/20 1241  . hydrOXYzine (ATARAX/VISTARIL) tablet 10 mg  10 mg Oral TID PRN Charm Rings, NP   10 mg at 07/25/20 0824  . OLANZapine zydis (ZYPREXA) disintegrating tablet 5 mg  5 mg Oral Q8H PRN Ardis Hughs, NP   5 mg at 07/24/20 1628   And  . LORazepam (ATIVAN) tablet 1 mg  1 mg Oral PRN Ardis Hughs, NP       And  . ziprasidone (GEODON) injection 20 mg  20 mg Intramuscular PRN Ardis Hughs, NP      . magnesium hydroxide (MILK OF MAGNESIA) suspension 30 mL  30 mL Oral Daily PRN Ardis Hughs, NP      . multivitamin with minerals tablet 1 tablet  1 tablet Oral Daily Antonieta Pert, MD   1 tablet at 07/25/20 [phone]  . traZODone (DESYREL) tablet 50 mg  50 mg Oral QHS PRN Claudie Revering, MD        Lab Results:  Results for orders placed or performed during the hospital encounter of 07/23/20 (from the past 48 hour(s))  Hemoglobin A1c     Status: None   Collection Time: 07/24/20  6:36 AM  Result Value Ref Range   Hgb A1c MFr Bld 5.5 4.8 - 5.6 %     Comment: (NOTE) Pre diabetes:          5.7%-6.4%  Diabetes:              >6.4%  Glycemic control for   <7.0% adults with diabetes    Mean Plasma Glucose 111.15 mg/dL    Comment: Performed at [facility], [address] [place], [place] [address]  Lipid panel     Status: Abnormal   Collection Time: 07/24/20  6:36 AM  Result Value Ref Range   Cholesterol 248 (H) 0 - 200 mg/dL   Triglycerides 119 (H) <150 mg/dL   HDL 65 >14 mg/dL   Total CHOL/HDL Ratio 3.8 RATIO   VLDL 30 0 - 40 mg/dL   LDL Cholesterol 782 (H) 0 - 99 mg/dL    Comment:        Total Cholesterol/HDL:CHD Risk Coronary Heart Disease Risk Table                     Men   Women  1/2 Average Risk   3.4   3.3  Average Risk       5.0   4.4  2 X Average Risk   9.6   7.1  3 X Average Risk  23.4   11.0        Use the calculated Patient Ratio above and the CHD Risk Table to determine the patient's CHD Risk.        ATP III CLASSIFICATION (LDL):  <100     mg/dL   Optimal  956-213  mg/dL   Near or Above                    Optimal  130-159  mg/dL   Borderline  086-578  mg/dL   High  >469     mg/dL   Very High Performed at [facility], [address] [place], [place] [address]   TSH     Status: None   Collection Time: 07/24/20  6:36 AM  Result Value Ref Range   TSH 2.323 0.350 - 4.500 uIU/mL    Comment: Performed by a 3rd Generation assay with a functional sensitivity of <=0.01 uIU/mL. Performed at [facility], [address] [place], [place] [address]   Comprehensive metabolic panel     Status: Abnormal   Collection Time: 07/25/20  6:40 AM  Result Value Ref Range   Sodium 142 135 - 145 mmol/L   Potassium 3.9 3.5 - 5.1 mmol/L   Chloride 108 98 - 111 mmol/L   CO2 25 22 - 32 mmol/L   Glucose, Bld 117 (H) 70 - 99 mg/dL    Comment: Glucose reference range applies only to samples taken after fasting for at least 8 hours.   BUN 14 6 - 20 mg/dL   Creatinine, Ser 2.94 0.44  - 1.00 mg/dL   Calcium 8.9 8.9 - 76.5 mg/dL   Total Protein 7.3 6.5 - 8.1 g/dL   Albumin 3.9 3.5 - 5.0 g/dL   AST 22 15 - 41 U/L   ALT 19 0 - 44 U/L   Alkaline Phosphatase 79 38 - 126 U/L   Total Bilirubin 0.6 0.3 - 1.2 mg/dL   GFR, Estimated >46 >50 mL/min    Comment: (NOTE) Calculated using the CKD-EPI Creatinine Equation (2021)    Anion gap 9 5 - 15    Comment: Performed at [facility], [address], [place], [place] [address]    Blood Alcohol level:  Lab Results  Component Value Date   ETH <10 07/15/2020   ETH <10 01/01/2020    Metabolic Disorder Labs: Lab Results  Component Value Date   HGBA1C 5.5 07/24/2020   MPG 111.15 07/24/2020   No results found for: PROLACTIN Lab Results  Component Value Date   CHOL 248 (H) 07/24/2020   TRIG 150 (H) 07/24/2020   HDL 65 07/24/2020   CHOLHDL 3.8 07/24/2020   VLDL 30 07/24/2020   LDLCALC 153 (H) 07/24/2020    Physical Findings: AIMS:  , ,  ,  ,    CIWA:    COWS:     Musculoskeletal: Strength & Muscle Tone: within normal limits Gait & Station: normal Patient leans: N/A  Psychiatric Specialty Exam:  Presentation  General Appearance: Appropriate for Environment; Casual; Well Groomed  Eye Contact:Good  Speech:Clear and Coherent; Normal Rate  Speech Volume:Normal  Handedness:Right   Mood and Affect  Mood:Anxious; Irritable  Affect:Congruent   Thought Process  Thought Processes:Coherent; Goal Directed  Descriptions of Associations:Intact  Orientation:Full (Time, Place and Person)  Thought Content:Logical; Perseveration (Some perseveration on feeling that her outpatient team has not treated her well)  History of Schizophrenia/Schizoaffective disorder:No  Duration of Psychotic Symptoms:No data recorded Hallucinations:Hallucinations: None  Ideas of Reference:None  Suicidal Thoughts:Suicidal Thoughts: Yes, Passive SI Active Intent and/or Plan: With Intent; With  Plan  Homicidal Thoughts:Homicidal Thoughts: Yes, Active HI Active Intent and/or Plan: Without Plan   Sensorium  Memory:Immediate Good; Recent Good; Remote Good  Judgment:Poor  Insight:Fair   Executive Functions  Concentration:Fair  Attention Span:Fair  Recall:Fair  Fund of Knowledge:Fair  Language:Good   Psychomotor Activity  Psychomotor Activity:Psychomotor Activity: Normal   Assets  Assets:Communication Skills; Desire for Improvement; Physical Health; Resilience   Sleep  Sleep:Sleep: Good Number of Hours of Sleep: 4.75    Physical Exam: Physical Exam Vitals  and nursing note reviewed.  HENT:     Head: Normocephalic and atraumatic.  Neurological:     General: No focal deficit present.     Mental Status: She is alert and oriented to person, place, and time.    ROS Blood pressure 118/73, pulse 86, temperature 98 F (36.7 C), temperature source Oral, resp. rate 18, height 4\' 10"  (1.473 m), weight 47.2 kg, SpO2 100 %. Body mass index is 21.74 kg/m.   Treatment Plan Summary: Daily contact with patient to assess and evaluate symptoms and progress in treatment and Medication management   Continue every 15-minute observation status  Encourage patient to participate in group therapy and therapeutic milieu.  Mood symptoms -Continue Lexapro 15 mg daily -Continue Abilify 2 mg daily  Anxiety symptoms -Continue Lexapro 15 mg daily -Continue hydroxyzine 10 mg 3 times daily PRN  Insomnia -Continue trazodone 50 mg at bedtime PRN  Hypertension -Continue atenolol 25 mg daily  UTI -Continue Augmentin 875-125 mg Q12 hours  Muscle spasms -Continue baclofen 10 mg 3 times daily PRN  Social work is working on .  Patient has been provided a list of housing resources and and has been encouraged to call today to start working on housing plan.  I certify that inpatient services furnished can reasonably be expected to improve the patient's  condition.  Architect, MD 07/25/2020, 3:24 PM

## 2020-07-25 NOTE — Tx Team (Signed)
Interdisciplinary Treatment and Diagnostic Plan Update  07/25/2020 Time of Session: 9:30am Jasmin Berry MRN: [id number]  Principal Diagnosis: Major depressive disorder, recurrent severe without psychotic features (HCC)  Secondary Diagnoses: Principal Problem:   Major depressive disorder, recurrent severe without psychotic features (HCC) Active Problems:   Suicidal ideations   Current Medications:  Current Facility-Administered Medications  Medication Dose Route Frequency Provider Last Rate Last Admin  . acetaminophen (TYLENOL) tablet 650 mg  650 mg Oral Q6H PRN Ardis Hughs, NP      . alum & mag hydroxide-simeth (MAALOX/MYLANTA) 200-200-20 MG/5ML suspension 30 mL  30 mL Oral Q4H PRN Ardis Hughs, NP      . amoxicillin-clavulanate (AUGMENTIN) 875-125 MG per tablet 1 tablet  1 tablet Oral Q12H Ardis Hughs, NP   1 tablet at 07/25/20 [phone]  . ARIPiprazole (ABILIFY) tablet 2 mg  2 mg Oral Daily Claudie Revering, MD   2 mg at 07/25/20 2703  . atenolol (TENORMIN) tablet 25 mg  25 mg Oral Daily Vernard Gambles H, NP   25 mg at 07/25/20 5009  . baclofen (LIORESAL) tablet 10 mg  10 mg Oral TID PRN Ardis Hughs, NP      . escitalopram (LEXAPRO) tablet 15 mg  15 mg Oral Daily Antonieta Pert, MD   15 mg at 07/25/20 3818  . feeding supplement (ENSURE ENLIVE / ENSURE PLUS) liquid 237 mL  237 mL Oral BID BM Antonieta Pert, MD      . hydrOXYzine (ATARAX/VISTARIL) tablet 10 mg  10 mg Oral TID PRN Charm Rings, NP   10 mg at 07/25/20 0824  . OLANZapine zydis (ZYPREXA) disintegrating tablet 5 mg  5 mg Oral Q8H PRN Ardis Hughs, NP   5 mg at 07/24/20 1628   And  . LORazepam (ATIVAN) tablet 1 mg  1 mg Oral PRN Ardis Hughs, NP       And  . ziprasidone (GEODON) injection 20 mg  20 mg Intramuscular PRN Ardis Hughs, NP      . magnesium hydroxide (MILK OF MAGNESIA) suspension 30 mL  30 mL Oral Daily PRN Ardis Hughs, NP      . multivitamin with minerals  tablet 1 tablet  1 tablet Oral Daily Antonieta Pert, MD   1 tablet at 07/25/20 [phone]  . traZODone (DESYREL) tablet 50 mg  50 mg Oral QHS PRN Claudie Revering, MD       PTA Medications: Medications Prior to Admission  Medication Sig Dispense Refill Last Dose  . atenolol (TENORMIN) 25 MG tablet Take 1 tablet (25 mg total) by mouth daily. (Patient taking differently: Take 12.5-25 mg by mouth daily.) 30 tablet 0 07/23/2020 at 11am  . baclofen (LIORESAL) 10 MG tablet Take 10 mg by mouth 3 (three) times daily as needed for muscle spasms.    Past Month  . diphenhydrAMINE (BENADRYL) 25 MG tablet Take 25 mg by mouth every 6 (six) hours as needed for itching.   Past Week  . escitalopram (LEXAPRO) 10 MG tablet TAKE 1 TABLET (10 MG TOTAL) BY MOUTH AT BEDTIME. (Patient taking differently: Take 10 mg by mouth daily.) 30 tablet 0 07/23/2020  . hydrocortisone 2.5 % cream Apply 1 application topically daily as needed (itching).   Past Week  . Magnesium Oxide, Antacid, 500 MG CAPS Take 500 mg by mouth daily.   Past Week  . Omega-3 Fatty Acids (FISH OIL) 1000 MG CAPS Take 1,000 mg by mouth  2 (two) times daily.   Past Week  . Turmeric 450 MG CAPS Take 450 mg by mouth daily.   Past Week    Patient Stressors: Financial difficulties Loss of cat  Patient Strengths: Active sense of humor Capable of independent living Motivation for treatment/growth  Treatment Modalities: Medication Management, Group therapy, Case management,  1 to 1 session with clinician, Psychoeducation, Recreational therapy.   Physician Treatment Plan for Primary Diagnosis: Major depressive disorder, recurrent severe without psychotic features (HCC) Long Term Goal(s): Improvement in symptoms so as ready for discharge Improvement in symptoms so as ready for discharge   Short Term Goals: Ability to identify changes in lifestyle to reduce recurrence of condition will improve Ability to verbalize feelings will improve Ability to disclose and  discuss suicidal ideas Ability to demonstrate self-control will improve Ability to identify and develop effective coping behaviors will improve Ability to maintain clinical measurements within normal limits will improve Compliance with prescribed medications will improve Ability to identify triggers associated with substance abuse/mental health issues will improve Ability to identify changes in lifestyle to reduce recurrence of condition will improve Ability to verbalize feelings will improve Ability to disclose and discuss suicidal ideas Ability to demonstrate self-control will improve Ability to identify and develop effective coping behaviors will improve Ability to maintain clinical measurements within normal limits will improve Compliance with prescribed medications will improve Ability to identify triggers associated with substance abuse/mental health issues will improve  Medication Management: Evaluate patient's response, side effects, and tolerance of medication regimen.  Therapeutic Interventions: 1 to 1 sessions, Unit Group sessions and Medication administration.  Evaluation of Outcomes: Progressing  Physician Treatment Plan for Secondary Diagnosis: Principal Problem:   Major depressive disorder, recurrent severe without psychotic features (HCC) Active Problems:   Suicidal ideations  Long Term Goal(s): Improvement in symptoms so as ready for discharge Improvement in symptoms so as ready for discharge   Short Term Goals: Ability to identify changes in lifestyle to reduce recurrence of condition will improve Ability to verbalize feelings will improve Ability to disclose and discuss suicidal ideas Ability to demonstrate self-control will improve Ability to identify and develop effective coping behaviors will improve Ability to maintain clinical measurements within normal limits will improve Compliance with prescribed medications will improve Ability to identify triggers  associated with substance abuse/mental health issues will improve Ability to identify changes in lifestyle to reduce recurrence of condition will improve Ability to verbalize feelings will improve Ability to disclose and discuss suicidal ideas Ability to demonstrate self-control will improve Ability to identify and develop effective coping behaviors will improve Ability to maintain clinical measurements within normal limits will improve Compliance with prescribed medications will improve Ability to identify triggers associated with substance abuse/mental health issues will improve     Medication Management: Evaluate patient's response, side effects, and tolerance of medication regimen.  Therapeutic Interventions: 1 to 1 sessions, Unit Group sessions and Medication administration.  Evaluation of Outcomes: Progressing   RN Treatment Plan for Primary Diagnosis: Major depressive disorder, recurrent severe without psychotic features (HCC) Long Term Goal(s): Knowledge of disease and therapeutic regimen to maintain health will improve  Short Term Goals: Ability to verbalize feelings will improve, Ability to disclose and discuss suicidal ideas and Ability to identify and develop effective coping behaviors will improve  Medication Management: RN will administer medications as ordered by provider, will assess and evaluate patient's response and provide education to patient for prescribed medication. RN will report any adverse and/or side effects to prescribing  provider.  Therapeutic Interventions: 1 on 1 counseling sessions, Psychoeducation, Medication administration, Evaluate responses to treatment, Monitor vital signs and CBGs as ordered, Perform/monitor CIWA, COWS, AIMS and Fall Risk screenings as ordered, Perform wound care treatments as ordered.  Evaluation of Outcomes: Progressing   LCSW Treatment Plan for Primary Diagnosis: Major depressive disorder, recurrent severe without psychotic  features (HCC) Long Term Goal(s): Safe transition to appropriate next level of care at discharge, Engage patient in therapeutic group addressing interpersonal concerns.  Short Term Goals: Engage patient in aftercare planning with referrals and resources, Increase social support and Increase ability to appropriately verbalize feelings  Therapeutic Interventions: Assess for all discharge needs, 1 to 1 time with Social worker, Explore available resources and support systems, Assess for adequacy in community support network, Educate family and significant other(s) on suicide prevention, Complete Psychosocial Assessment, Interpersonal group therapy.  Evaluation of Outcomes: Progressing   Progress in Treatment: Attending groups: Yes. Participating in groups: Yes. Taking medication as prescribed: Yes. Toleration medication: Yes. Family/Significant other contact made: Yes, individual(s) contacted:  [facility] case manager Patient understands diagnosis: Yes. Discussing patient identified problems/goals with staff: Yes. Medical problems stabilized or resolved: Yes. Denies suicidal/homicidal ideation: Yes. Issues/concerns per patient self-inventory: Yes. Other: None  New problem(s) identified: No, Describe:  None  New Short Term/Long Term Goal(s):medication stabilization, elimination of SI thoughts, development of comprehensive mental wellness plan.  Patient Goals:  "not to kill myself or anyone else"  Discharge Plan or Barriers: Patient recently admitted. CSW will continue to follow and assess for appropriate referrals and possible discharge planning.  Reason for Continuation of Hospitalization: Medication stabilization Suicidal ideation  Estimated Length of Stay: 3-5 days   Attendees: Patient: Jasmin Berry 07/25/2020   Physician: Clifton Custard, MD 07/25/2020   Nursing:  07/25/2020   RN Care Manager: 07/25/2020   Social Worker: Fredirick Lathe, LCSWA 07/25/2020   Recreational Therapist:  07/25/2020    Other:  07/25/2020   Other:  07/25/2020   Other: 07/25/2020       Scribe for Treatment Team: Felizardo Hoffmann, Theresia Majors 07/25/2020 11:33 AM

## 2020-07-25 NOTE — Progress Notes (Signed)
Patient did attend the evening speaker AA meeting.  

## 2020-07-25 NOTE — Progress Notes (Signed)
   07/24/20 1945  Psych Admission Type (Psych Patients Only)  Admission Status Voluntary  Psychosocial Assessment  Patient Complaints Anxiety;Depression  Eye Contact Fair  Facial Expression Animated;Anxious  Affect Anxious  Speech Logical/coherent;Tangential  Interaction Assertive  Motor Activity Other (Comment) (WDL)  Appearance/Hygiene In scrubs  Behavior Characteristics Cooperative;Appropriate to situation  Mood Anxious;Depressed;Sad  Thought Process  Coherency WDL  Content WDL  Delusions None reported or observed  Perception WDL  Hallucination None reported or observed  Judgment Impaired  Confusion None  Danger to Self  Current suicidal ideation? Active  Self-Injurious Behavior Some self-injurious ideation observed or expressed.  No lethal plan expressed   Agreement Not to Harm Self Yes  Description of Agreement verbal contract  Danger to Others  Danger to Others None reported or observed

## 2020-07-26 MED ORDER — NICOTINE POLACRILEX 2 MG MT GUM
CHEWING_GUM | OROMUCOSAL | Status: AC
  Filled 2020-07-26: qty 1

## 2020-07-26 MED ORDER — NICOTINE POLACRILEX 2 MG MT GUM
2.0000 mg | CHEWING_GUM | OROMUCOSAL | Status: DC | PRN
  Administered 2020-07-27 – 2020-07-28 (×5): 2 mg via ORAL
  Filled 2020-07-26 (×2): qty 1

## 2020-07-26 NOTE — Progress Notes (Signed)
D: Patient presents with anxious affect. Patient is positive for SI but verbally contracts for safety. Patient is positive for HI but states it is not towards anyone at the facility.  A: Provided positive reinforcement and encouragement.  R: Patient cooperative and receptive to efforts. Patient remains safe on the unit.   07/25/20 2117  Psych Admission Type (Psych Patients Only)  Admission Status Voluntary  Psychosocial Assessment  Patient Complaints Anxiety;Depression  Eye Contact Fair  Facial Expression Animated  Affect Anxious  Speech Logical/coherent;Tangential  Interaction Assertive  Motor Activity Other (Comment) (WDL)  Appearance/Hygiene In scrubs  Behavior Characteristics Cooperative;Appropriate to situation  Mood Depressed;Anxious  Thought Process  Coherency WDL  Content WDL  Delusions None reported or observed  Perception WDL  Hallucination None reported or observed  Judgment Impaired  Confusion None  Danger to Self  Current suicidal ideation? Active  Self-Injurious Behavior Some self-injurious ideation observed or expressed.  No lethal plan expressed   Agreement Not to Harm Self Yes  Description of Agreement verbal contract  Danger to Others  Danger to Others None reported or observed

## 2020-07-26 NOTE — Progress Notes (Signed)
   07/26/20 2236  Psych Admission Type (Psych Patients Only)  Admission Status Voluntary  Psychosocial Assessment  Patient Complaints Anxiety  Eye Contact Fair  Facial Expression Animated  Affect Appropriate to circumstance  Speech Logical/coherent  Interaction Assertive  Motor Activity Fidgety  Appearance/Hygiene Unremarkable  Behavior Characteristics Appropriate to situation  Mood Pleasant  Thought Process  Coherency WDL  Content WDL  Delusions None reported or observed  Perception WDL  Hallucination None reported or observed  Judgment Impaired  Confusion None  Danger to Self  Current suicidal ideation? Denies  Self-Injurious Behavior No self-injurious ideation or behavior indicators observed or expressed   Agreement Not to Harm Self Yes  Description of Agreement verbal contract  Danger to Others  Danger to Others None reported or observed

## 2020-07-26 NOTE — [facility] Group Notes (Signed)
[facility] Group Notes:  (Nursing/MHT/Case Management/Adjunct)  Date:  07/26/2020  Time:  10:40 PM  Type of Therapy:  Wrap up group  Participation Level:  Active  Participation Quality:  Attentive and Monopolizing  Affect:  Excited  Cognitive:  Appropriate  Insight:  Improving  Engagement in Group:  Distracting and Monopolizing  Modes of Intervention:  Discussion and Education  Summary of Progress/Problems:  Unit rules and fall risk interventions reviewed.  She reported her day was 5/10 (10 the best).  She was able to accomplish her goal of"working on my anger, I just walked away."    [employer] 07/26/2020, 10:40 PM

## 2020-07-26 NOTE — [facility] Group Notes (Signed)
[facility] Group Notes:  (Nursing/MHT/Case Management/Adjunct)  Date:  07/26/2020  Time:  5:51 PM  Type of Therapy:  Psychoeducational Skills  Participation Level:  Active  Participation Quality:  Appropriate  Affect:  Appropriate  Cognitive:  Appropriate  Insight:  Appropriate  Engagement in Group:  Engaged  Modes of Intervention:  Activity  Summary of Progress/Problems: Relaxation techniques discussed in group. Pt actively participated.  Malva Limes 07/26/2020, 5:51 PM

## 2020-07-26 NOTE — Progress Notes (Signed)
[facility] MD Progress Note  07/26/2020 10:19 AM Jasmin Berry  MRN:  [id number]   Subjective: [place] reports, "I'm not really doing okay today. I'm still feeling suicidal & homicidal, but to anyone here. I need a lot more time to get to where I need to be emotionally & mentally. The medicines are helping. I need some cream for the rash on skin".   Reason for admission: Patient is a 55 year old homeless female with a reported history of prior diagnoses of depression, borderline personality disorder, PTSD and bipolar disorder, history of multiple prior past suicide attempts, history of nonsuicidal self-injurious behavior who was admitted for worsening symptoms of depression, suicidal ideation and thoughts of harming others following the eviction from her apartment and the loss of her emotional support animal.    (Per previous notes): Objective: Medical record reviewed.  Patient case discussed in detail with members of the treatment team.  Patient was seen and interviewed today with team social worker present in the patient's room.  The patient stated that she is still "very angry and very sad" and continues to have thoughts of "wanting to hurt myself by taking pills."  The patient states she is also having thoughts about hurting her [facility] team and her landlord "violently because I am very angry."  The patient states that she is upset at the loss of her housing and the lack of help she is received from her [facility] team in obtaining other housing.  She states she spoke with a friend with whom she may be able to stay briefly but is interested in housing resources.  Team social worker and I discussed with patient that her stay here would be short-term and that we would not be able to find her an apartment and she most likely would be discharged to shelter unless is a friend with whom she can stay.  Social worker provided patient with housing resources. I encouraged patient to use the list provided by social work and to start to  make phone calls today to places she is interested in going after discharge since her stay here will be brief.  Stated understanding and stated she would make calls today.  She reports that she is tolerating the increase in Lexapro and the initiation of Abilify well without side effects.  She reported no other complaints.  On interview today she is neatly dressed and groomed wearing her own clothes.  She maintains good eye contact.  She is polite and cooperative.  Speech is hyperverbal but not pressured.  Scribes her mood as very angry and very sad, however she presents as essentially euthymic although angry about stressors that have resulted in her lost housing.  No psychotic content elicited.  She does not appear to attend or respond to internal stimuli and does not report perceptual abnormalities.  She is alert and oriented.  Attention and concentration are fair.  Insight is fair.  Judgment is poor.  The patient's primary motivation for the current admission appears to be to avoid homelessness.  She slept adequately last night and has been eating.  She did not attend groups but did attend groups today appropriately.  Labs today include CMP which revealed glucose of 117 and was otherwise WNL.  Lipid panel drawn yesterday revealed total cholesterol of 248, LDL of 153 and triglycerides of 150 and otherwise WNL.  Hemoglobin A1c was 5.5.  TSH was 2.323.  Signs this morning include BP 138/76 sitting and 118/73 standing, pulse of 76 sitting and 86 standing,  O2 sat of 100% and temperature of 98.3.  Principal Problem: Major depressive disorder, recurrent severe without psychotic features (HCC)  Diagnosis: Principal Problem:   Major depressive disorder, recurrent severe without psychotic features (HCC) Active Problems:   Suicidal ideations  Total Time spent with patient: 15 minutes  Past Psychiatric History: Patient states that she has made between 7 and 9 prior suicide attempts during her life most of which  have been by overdose at times in combination with alcohol.  She also reports a history of nonsuicidal self-injurious behavior of scratching herself or superficial cutting or picking at scabs in order to calm herself down.  She denies any history of prior inpatient psychiatric admissions.  She states that she had a trial of lamotrigine in the past and has unknown side effects and it was discontinued.  She took Zoloft and Paxil in the past which made her feel more aggressive.   Past Medical History:  Past Medical History:  Diagnosis Date  . Hypertension   . PTSD (post-traumatic stress disorder)   . Renal disorder   . Vertigo    History reviewed. No pertinent surgical history. Family History: History reviewed. No pertinent family history.  Family Psychiatric  History: Patient states that her mother had mental health issues and [facility].  She knows no other information about her family history.    Social History:  Social History   Substance and Sexual Activity  Alcohol Use No     Social History   Substance and Sexual Activity  Drug Use Yes   Comment: CBD    Social History   Socioeconomic History  . Marital status: Single    Spouse name: Not on file  . Number of children: Not on file  . Years of education: Not on file  . Highest education level: Not on file  Occupational History  . Not on file  Tobacco Use  . Smoking status: Current Every Day Smoker    Packs/day: 1.00    Types: Cigarettes  . Smokeless tobacco: Never Used  Vaping Use  . Vaping Use: Every day  Substance and Sexual Activity  . Alcohol use: No  . Drug use: Yes    Comment: CBD  . Sexual activity: Not on file  Other Topics Concern  . Not on file  Social History Narrative  . Not on file   Social Determinants of Health   Financial Resource Strain: Not on file  Food Insecurity: Not on file  Transportation Needs: Not on file  Physical Activity: Not on file  Stress: Not on file  Social  Connections: Not on file   Additional Social History:   Sleep: Good  Appetite:  Good  Current Medications: Current Facility-Administered Medications  Medication Dose Route Frequency Provider Last Rate Last Admin  . acetaminophen (TYLENOL) tablet 650 mg  650 mg Oral Q6H PRN Ardis Hughsoleman, Carolyn H, NP      . alum & mag hydroxide-simeth (MAALOX/MYLANTA) 200-200-20 MG/5ML suspension 30 mL  30 mL Oral Q4H PRN Ardis Hughsoleman, Carolyn H, NP      . amoxicillin-clavulanate (AUGMENTIN) 875-125 MG per tablet 1 tablet  1 tablet Oral Q12H Ardis Hughsoleman, Carolyn H, NP   1 tablet at 07/26/20 [phone]  . ARIPiprazole (ABILIFY) tablet 2 mg  2 mg Oral Daily Claudie ReveringJames, Martha L, MD   2 mg at 07/26/20 [phone]  . atenolol (TENORMIN) tablet 25 mg  25 mg Oral Daily Ardis Hughsoleman, Carolyn H, NP   25 mg at 07/26/20 [id number]  .  baclofen (LIORESAL) tablet 10 mg  10 mg Oral TID PRN Ardis Hughs, NP      . escitalopram (LEXAPRO) tablet 15 mg  15 mg Oral Daily Antonieta Pert, MD   15 mg at 07/26/20 0827  . feeding supplement (ENSURE ENLIVE / ENSURE PLUS) liquid 237 mL  237 mL Oral BID BM Antonieta Pert, MD   237 mL at 07/25/20 1241  . hydrOXYzine (ATARAX/VISTARIL) tablet 10 mg  10 mg Oral TID PRN Charm Rings, NP   10 mg at 07/25/20 2117  . OLANZapine zydis (ZYPREXA) disintegrating tablet 5 mg  5 mg Oral Q8H PRN Ardis Hughs, NP   5 mg at 07/24/20 1628   And  . LORazepam (ATIVAN) tablet 1 mg  1 mg Oral PRN Ardis Hughs, NP       And  . ziprasidone (GEODON) injection 20 mg  20 mg Intramuscular PRN Ardis Hughs, NP      . magnesium hydroxide (MILK OF MAGNESIA) suspension 30 mL  30 mL Oral Daily PRN Ardis Hughs, NP      . multivitamin with minerals tablet 1 tablet  1 tablet Oral Daily Antonieta Pert, MD   1 tablet at 07/26/20 [id number]  . traZODone (DESYREL) tablet 50 mg  50 mg Oral QHS PRN Claudie Revering, MD   50 mg at 07/25/20 2116   Lab Results:  Results for orders placed or performed during the hospital  encounter of 07/23/20 (from the past 48 hour(s))  Comprehensive metabolic panel     Status: Abnormal   Collection Time: 07/25/20  6:40 AM  Result Value Ref Range   Sodium 142 135 - 145 mmol/L   Potassium 3.9 3.5 - 5.1 mmol/L   Chloride 108 98 - 111 mmol/L   CO2 25 22 - 32 mmol/L   Glucose, Bld 117 (H) 70 - 99 mg/dL    Comment: Glucose reference range applies only to samples taken after fasting for at least 8 hours.   BUN 14 6 - 20 mg/dL   Creatinine, Ser 7.56 0.44 - 1.00 mg/dL   Calcium 8.9 8.9 - 43.3 mg/dL   Total Protein 7.3 6.5 - 8.1 g/dL   Albumin 3.9 3.5 - 5.0 g/dL   AST 22 15 - 41 U/L   ALT 19 0 - 44 U/L   Alkaline Phosphatase 79 38 - 126 U/L   Total Bilirubin 0.6 0.3 - 1.2 mg/dL   GFR, Estimated >29 >51 mL/min    Comment: (NOTE) Calculated using the CKD-EPI Creatinine Equation (2021)    Anion gap 9 5 - 15    Comment: Performed at [facility], [address], [place], [place] [address]   Blood Alcohol level:  Lab Results  Component Value Date   ETH <10 07/15/2020   ETH <10 01/01/2020   Metabolic Disorder Labs: Lab Results  Component Value Date   HGBA1C 5.5 07/24/2020   MPG 111.15 07/24/2020   No results found for: PROLACTIN Lab Results  Component Value Date   CHOL 248 (H) 07/24/2020   TRIG 150 (H) 07/24/2020   HDL 65 07/24/2020   CHOLHDL 3.8 07/24/2020   VLDL 30 07/24/2020   LDLCALC 153 (H) 07/24/2020   Physical Findings: AIMS:  , ,  ,  ,    CIWA:    COWS:     Musculoskeletal: Strength & Muscle Tone: within normal limits Gait & Station: normal Patient leans: N/A  Psychiatric Specialty Exam:  Presentation  General Appearance: Appropriate for Environment; Casual; Well Groomed  Eye Contact:Good  Speech:Clear and Coherent; Normal Rate  Speech Volume:Normal  Handedness:Right  Mood and Affect  Mood:Anxious; Irritable  Affect:Congruent  Thought Process  Thought Processes:Coherent; Goal Directed  Descriptions of  Associations:Intact  Orientation:Full (Time, Place and Person)  Thought Content:Logical; Perseveration (Some perseveration on feeling that her outpatient team has not treated her well)  History of Schizophrenia/Schizoaffective disorder:No  Duration of Psychotic Symptoms:No data recorded Hallucinations:Hallucinations: None  Ideas of Reference:None  Suicidal Thoughts:Suicidal Thoughts: Yes, Passive SI Active Intent and/or Plan: With Intent; With Plan  Homicidal Thoughts:Homicidal Thoughts: Yes, Active HI Active Intent and/or Plan: Without Plan  Sensorium  Memory:Immediate Good; Recent Good; Remote Good  Judgment:Poor  Insight:Fair  Executive Functions  Concentration:Fair  Attention Span:Fair  Recall:Fair  Fund of Knowledge:Fair  Language:Good  Psychomotor Activity  Psychomotor Activity:Psychomotor Activity: Normal  Assets  Assets:Communication Skills; Desire for Improvement; Physical Health; Resilience  Sleep  Sleep:Sleep: Good  Physical Exam: Physical Exam Vitals and nursing note reviewed.  HENT:     Head: Normocephalic and atraumatic.  Neurological:     General: No focal deficit present.     Mental Status: She is alert and oriented to person, place, and time.    ROS Blood pressure 132/81, pulse 80, temperature 97.6 F (36.4 C), temperature source Oral, resp. rate 18, height 4\' 10"  (1.473 m), weight 47.2 kg, SpO2 100 %. Body mass index is 21.74 kg/m.  Treatment Plan Summary: Daily contact with patient to assess and evaluate symptoms and progress in treatment and Medication management.  Continue inpatient hospitalization. Will continue today 07/26/2020 plan as below except where it is noted.  Continue every 15-minute observation status Encourage patient to participate in group therapy and therapeutic milieu.  Mood symptoms -Continue Lexapro 15 mg daily -Continue Abilify 2 mg daily  Anxiety symptoms -Continue Lexapro 15 mg daily -Continue  hydroxyzine 10 mg 3 times daily PRN  Insomnia -Continue trazodone 50 mg at bedtime PRN  Hypertension -Continue atenolol 25 mg daily  UTI -Continue Augmentin 875-125 mg Q12 hours  Muscle spasms -Continue baclofen 10 mg 3 times daily PRN  Social work is working on 09/25/2020.  Patient has been provided a list of housing resources and and has been encouraged to call today to start working on housing plan.  I certify that inpatient services furnished can reasonably be expected to improve the patient's condition.  Architect, NP, PMHNP, FNP-BC 07/26/2020, 10:19 AMPatient ID: 09/25/2020, female   DOB: 03-14-1966, 55 y.o.   MRN: [id number]

## 2020-07-26 NOTE — [facility] Group Notes (Signed)
.  Psychoeducational Group Note  Date: 07/26/2020 Time: 0900-1000    Goal Setting   Purpose of Group: This group helps to provide patients with the steps of setting a goal that is specific, measurable, attainable, realistic and time specific. A discussion on how we keep ourselves stuck with negative self talk.    Participation Level:  Active  Participation Quality:  Pt Doesn't interrupt but has to say something about everything.  Affect:  Appropriate  Cognitive:  Appropriate  Insight:  lacking  Engagement in Group:  Engaged  Additional Comments:  .Marland KitchenMarland KitchenWhen ever there was a short space of quiet in the room Pt wanted to share her thoughts. Became upset with a pt after the group was over and pulled this writer aside to tell her about their misunderstand. Pt given homework of 30 positive things about herself.  Dione Housekeeper

## 2020-07-26 NOTE — Progress Notes (Signed)
   07/26/20 2234  COVID-19 Daily Checkoff  Have you had a fever (temp > 37.80C/100F)  in the past 24 hours?  No  If you have had runny nose, nasal congestion, sneezing in the past 24 hours, has it worsened? No  COVID-19 EXPOSURE  Have you traveled outside the state in the past 14 days? No  Have you been in contact with someone with a confirmed diagnosis of COVID-19 or PUI in the past 14 days without wearing appropriate PPE? No  Have you been living in the same home as a person with confirmed diagnosis of COVID-19 or a PUI (household contact)? No  Have you been diagnosed with COVID-19? No

## 2020-07-26 NOTE — [facility] Group Notes (Signed)
.  Psychoeducational Group Note    Date:07/26/2020 Time: 1300-1400    Life Skills:  A group where two lists are made. What people need and what are things that we do that are healthy. The lists are developed by the patients and it is explained that we often do the actions that are not healthy to get our list of needs met.   Purpose of Group: . The group focus' on teaching patients on how to identify their needs and how to develop the coping skills needed to get their needs met  Participation Level:  Active  Participation Quality:  Appropriate  Affect:  Appropriate  Cognitive:  Oriented  Insight:  Improving  Engagement in Group:  Engaged  Additional Comments:  Pt attended the group. Has tried to take the class over several  times by explaining what people mean when they say something.Has interceded several times by reminding Pt everyone has their own meaning for their feelings and not another person can explain in. Rates her self at a 7.5.   Paulino Rily

## 2020-07-26 NOTE — Progress Notes (Signed)
   07/26/20 1900  Psych Admission Type (Psych Patients Only)  Admission Status Voluntary  Psychosocial Assessment  Patient Complaints Anxiety;Depression  Eye Contact Fair  Facial Expression Animated  Affect Anxious  Speech Logical/coherent;Tangential  Interaction Assertive  Motor Activity Other (Comment) (WDL)  Appearance/Hygiene In scrubs  Behavior Characteristics Cooperative;Appropriate to situation  Thought Process  Coherency WDL  Content WDL  Delusions None reported or observed  Perception WDL  Hallucination None reported or observed  Judgment Impaired  Confusion None  Danger to Self  Current suicidal ideation? Active  Self-Injurious Behavior Some self-injurious ideation observed or expressed.  No lethal plan expressed   Agreement Not to Harm Self Yes  Description of Agreement verbal contract  Danger to Others  Danger to Others None reported or observed

## 2020-07-27 MED ORDER — HYDROCORTISONE 1 % EX CREA
TOPICAL_CREAM | Freq: Four times a day (QID) | CUTANEOUS | Status: DC | PRN
  Filled 2020-07-27: qty 28

## 2020-07-27 NOTE — Progress Notes (Signed)
Patient ID: Jasmin Berry, female   DOB: Aug 19, 1965, 55 y.o.   MRN: [id number]   D- Patient alert and oriented. Patient affect/mood reported 8/10.  Denies SI, HI, AVH, and pain. Patient Goal:  " do what's asked of me while I am do what's best when I leave here".   A- Scheduled medications administered to patient, per MD orders. Support and encouragement provided.  Routine safety checks conducted every 15 minutes.  Patient informed to notify staff with problems or concerns.  R- No adverse drug reactions noted. Patient contracts for safety at this time. Patient compliant with medications and treatment plan. Patient receptive, calm, and cooperative. Patient interacts well with others on the unit.  Patient remains safe at this time.            [place] NOVEL CORONAVIRUS (COVID-19) DAILY CHECK-OFF SYMPTOMS - answer yes or no to each - every day NO YES  Have you had a fever in the past 24 hours?   Fever (Temp > 37.80C / 100F) X    Have you had any of these symptoms in the past 24 hours?  New Cough   Sore Throat    Shortness of Breath   Difficulty Breathing   Unexplained Body Aches   X    Have you had any one of these symptoms in the past 24 hours not related to allergies?    Runny Nose   Nasal Congestion   Sneezing   X    If you have had runny nose, nasal congestion, sneezing in the past 24 hours, has it worsened?   X    EXPOSURES - check yes or no X    Have you traveled outside the state in the past 14 days?   X    Have you been in contact with someone with a confirmed diagnosis of COVID-19 or PUI in the past 14 days without wearing appropriate PPE?   X    Have you been living in the same home as a person with confirmed diagnosis of COVID-19 or a PUI (household contact)?     X    Have you been diagnosed with COVID-19?     X                                                                                                                             What to do next: Answered  NO to all: Answered YES to anything:    Proceed with unit schedule Follow the [facility] Inpatient Flowsheet.

## 2020-07-27 NOTE — Progress Notes (Signed)
[facility] Group Notes:  (Nursing/MHT/Case Management/Adjunct)  Date:  07/27/2020  Time: 2015  Type of Therapy:  wrap up group  Participation Level:  Active  Participation Quality:  Appropriate, Inattentive, Redirectable, Sharing and Supportive  Affect:  Excited  Cognitive:  Alert  Insight:  Improving  Engagement in Group:  Engaged  Modes of Intervention:  Clarification, Education and Support  Summary of Progress/Problems: Positive thinking and self-care were discussed.   Jasmin Berry 07/27/2020, 9:06 PM

## 2020-07-27 NOTE — Progress Notes (Signed)
   07/27/20 2320  Psych Admission Type (Psych Patients Only)  Admission Status Voluntary  Psychosocial Assessment  Patient Complaints Anxiety  Eye Contact Fair  Facial Expression Animated  Affect Appropriate to circumstance  Speech Logical/coherent  Interaction Assertive  Motor Activity Fidgety  Appearance/Hygiene Unremarkable  Behavior Characteristics Appropriate to situation  Mood Anxious  Thought Process  Coherency WDL  Content WDL  Delusions None reported or observed  Perception WDL  Hallucination None reported or observed  Judgment Impaired  Confusion None  Danger to Self  Current suicidal ideation? Denies  Self-Injurious Behavior No self-injurious ideation or behavior indicators observed or expressed   Agreement Not to Harm Self Yes  Description of Agreement verbal contract  Danger to Others  Danger to Others None reported or observed

## 2020-07-27 NOTE — Progress Notes (Signed)
   07/27/20 2323  COVID-19 Daily Checkoff  Have you had a fever (temp > 37.80C/100F)  in the past 24 hours?  No  If you have had runny nose, nasal congestion, sneezing in the past 24 hours, has it worsened? No  COVID-19 EXPOSURE  Have you traveled outside the state in the past 14 days? No  Have you been in contact with someone with a confirmed diagnosis of COVID-19 or PUI in the past 14 days without wearing appropriate PPE? No  Have you been living in the same home as a person with confirmed diagnosis of COVID-19 or a PUI (household contact)? No  Have you been diagnosed with COVID-19? No

## 2020-07-27 NOTE — Progress Notes (Signed)
   07/27/20 2320  Psych Admission Type (Psych Patients Only)  Admission Status Voluntary  Psychosocial Assessment  Patient Complaints Anxiety  Eye Contact Fair  Facial Expression Animated  Affect Appropriate to circumstance  Speech Logical/coherent  Interaction Assertive  Motor Activity Fidgety  Appearance/Hygiene Unremarkable  Behavior Characteristics Appropriate to situation  Mood Anxious  Thought Process  Coherency WDL  Content WDL  Delusions None reported or observed  Perception WDL  Hallucination None reported or observed  Judgment Impaired  Confusion None  Danger to Self  Current suicidal ideation? Denies  Self-Injurious Behavior No self-injurious ideation or behavior indicators observed or expressed   Agreement Not to Harm Self Yes  Description of Agreement verbal contract  Danger to Others  Danger to Others None reported or observed   

## 2020-07-27 NOTE — Plan of Care (Signed)
°  Problem: Education: °Goal: Emotional status will improve °Outcome: Progressing °Goal: Mental status will improve °Outcome: Progressing °Goal: Verbalization of understanding the information provided will improve °Outcome: Progressing °  °

## 2020-07-27 NOTE — Progress Notes (Signed)
[facility] MD Progress Note  07/27/2020 2:44 PM Jasmin Berry  MRN:  [id number]   Subjective: Jasmin Berry reports, "I'm not doing good right now. I need to know how to deal with my ongoing emotional issues, my childhood trauma & the hatred I have towards my mother. I need to see a therapist now. I have a lot of dark things to reveal about my life. These things can only be revealed with a therapist who can guide me on how to deal with them. I cry a lot, like I'm doing right now".  Reason for admission: Patient is a 55 year old homeless female with a reported history of prior diagnoses of depression, borderline personality disorder, PTSD and bipolar disorder, history of multiple prior past suicide attempts, history of nonsuicidal self-injurious behavior who was admitted for worsening symptoms of depression, suicidal ideation and thoughts of harming others following the eviction from her apartment and the loss of her emotional support animal.  Daily notes:  Jasmin Berry is seen, chart reviewed. The chart findings discussed with the treatment team. She presents tearful & ruminative this morning. She has just finished attending the morning group sessions prior to this follow-up care assessment. She says she is not feeling too good at this moment. She is asking for a therapist to talk to right now about her feelings. She says she has a lot of dark things to reveal about her life, experiences & trauma. She says those dark things can only be revealed with a therapist who will be able to listen & provider her with ways to start dealing with them. Patient still thinks she can be allowed to stay here as long as she would want. Patient is however encouraged to continue to apply her already learned coping skills. She will need both outpatient counseling sessions & medication management upon discharge. She is taking & tolerating her treatment regimen. She denies any side effects. She denies any SIHI, AVH, delusional thoughts or paranoia. She does  not appear to be responding to any internal stimuli. Will continue current plan of care as already in progress.   (Per previous notes): Objective: Medical record reviewed.  Patient case discussed in detail with members of the treatment team.  Patient was seen and interviewed today with team social worker present in the patient's room.  The patient stated that she is still "very angry and very sad" and continues to have thoughts of "wanting to hurt myself by taking pills."  The patient states she is also having thoughts about hurting her [facility] team and her landlord "violently because I am very angry."  The patient states that she is upset at the loss of her housing and the lack of help she is received from her [facility] team in obtaining other housing.  She states she spoke with a friend with whom she may be able to stay briefly but is interested in housing resources.  Team social worker and I discussed with patient that her stay here would be short-term and that we would not be able to find her an apartment and she most likely would be discharged to shelter unless is a friend with whom she can stay.  Social worker provided patient with housing resources. I encouraged patient to use the list provided by social work and to start to make phone calls today to places she is interested in going after discharge since her stay here will be brief.  Stated understanding and stated she would make calls today.  She reports that she is  tolerating the increase in Lexapro and the initiation of Abilify well without side effects.  She reported no other complaints.  On interview today she is neatly dressed and groomed wearing her own clothes.  She maintains good eye contact.  She is polite and cooperative.  Speech is hyperverbal but not pressured.  Scribes her mood as very angry and very sad, however she presents as essentially euthymic although angry about stressors that have resulted in her lost housing.  No psychotic content elicited.   She does not appear to attend or respond to internal stimuli and does not report perceptual abnormalities.  She is alert and oriented.  Attention and concentration are fair.  Insight is fair.  Judgment is poor.  The patient's primary motivation for the current admission appears to be to avoid homelessness.  She slept adequately last night and has been eating.  She did not attend groups but did attend groups today appropriately.  Labs today include CMP which revealed glucose of 117 and was otherwise WNL.  Lipid panel drawn yesterday revealed total cholesterol of 248, LDL of 153 and triglycerides of 150 and otherwise WNL.  Hemoglobin A1c was 5.5.  TSH was 2.323.  Signs this morning include BP 138/76 sitting and 118/73 standing, pulse of 76 sitting and 86 standing, O2 sat of 100% and temperature of 98.3.  Principal Problem: Major depressive disorder, recurrent severe without psychotic features (HCC)  Diagnosis: Principal Problem:   Major depressive disorder, recurrent severe without psychotic features (HCC) Active Problems:   Suicidal ideations  Total Time spent with patient: 15 minutes  Past Psychiatric History: Patient states that she has made between 7 and 9 prior suicide attempts during her life most of which have been by overdose at times in combination with alcohol.  She also reports a history of nonsuicidal self-injurious behavior of scratching herself or superficial cutting or picking at scabs in order to calm herself down.  She denies any history of prior inpatient psychiatric admissions.  She states that she had a trial of lamotrigine in the past and has unknown side effects and it was discontinued.  She took Zoloft and Paxil in the past which made her feel more aggressive.   Past Medical History:  Past Medical History:  Diagnosis Date  . Hypertension   . PTSD (post-traumatic stress disorder)   . Renal disorder   . Vertigo    History reviewed. No pertinent surgical history. Family  History: History reviewed. No pertinent family history.  Family Psychiatric  History: Patient states that her mother had mental health issues and was hospitalized at [facility].  She knows no other information about her family history.    Social History:  Social History   Substance and Sexual Activity  Alcohol Use No     Social History   Substance and Sexual Activity  Drug Use Yes   Comment: CBD    Social History   Socioeconomic History  . Marital status: Single    Spouse name: Not on file  . Number of children: Not on file  . Years of education: Not on file  . Highest education level: Not on file  Occupational History  . Not on file  Tobacco Use  . Smoking status: Current Every Day Smoker    Packs/day: 1.00    Types: Cigarettes  . Smokeless tobacco: Never Used  Vaping Use  . Vaping Use: Every day  Substance and Sexual Activity  . Alcohol use: No  . Drug use: Yes  Comment: CBD  . Sexual activity: Not on file  Other Topics Concern  . Not on file  Social History Narrative  . Not on file   Social Determinants of Health   Financial Resource Strain: Not on file  Food Insecurity: Not on file  Transportation Needs: Not on file  Physical Activity: Not on file  Stress: Not on file  Social Connections: Not on file   Additional Social History:   Sleep: Good  Appetite:  Good  Current Medications: Current Facility-Administered Medications  Medication Dose Route Frequency Provider Last Rate Last Admin  . acetaminophen (TYLENOL) tablet 650 mg  650 mg Oral Q6H PRN Ardis Hughsoleman, Carolyn H, NP      . alum & mag hydroxide-simeth (MAALOX/MYLANTA) 200-200-20 MG/5ML suspension 30 mL  30 mL Oral Q4H PRN Ardis Hughsoleman, Carolyn H, NP      . amoxicillin-clavulanate (AUGMENTIN) 875-125 MG per tablet 1 tablet  1 tablet Oral Q12H Ardis Hughsoleman, Carolyn H, NP   1 tablet at 07/27/20 0801  . ARIPiprazole (ABILIFY) tablet 2 mg  2 mg Oral Daily Claudie ReveringJames, Martha L, MD   2 mg at 07/27/20 0801  . atenolol  (TENORMIN) tablet 25 mg  25 mg Oral Daily Ardis Hughsoleman, Carolyn H, NP   25 mg at 07/27/20 0801  . baclofen (LIORESAL) tablet 10 mg  10 mg Oral TID PRN Ardis Hughsoleman, Carolyn H, NP   10 mg at 07/26/20 2111  . escitalopram (LEXAPRO) tablet 15 mg  15 mg Oral Daily Antonieta Pertlary, Greg Lawson, MD   15 mg at 07/27/20 0801  . feeding supplement (ENSURE ENLIVE / ENSURE PLUS) liquid 237 mL  237 mL Oral BID BM Antonieta Pertlary, Greg Lawson, MD   237 mL at 07/25/20 1241  . hydrOXYzine (ATARAX/VISTARIL) tablet 10 mg  10 mg Oral TID PRN Charm RingsLord, Jamison Y, NP   10 mg at 07/26/20 2108  . OLANZapine zydis (ZYPREXA) disintegrating tablet 5 mg  5 mg Oral Q8H PRN Ardis Hughsoleman, Carolyn H, NP   5 mg at 07/24/20 1628   And  . LORazepam (ATIVAN) tablet 1 mg  1 mg Oral PRN Ardis Hughsoleman, Carolyn H, NP       And  . ziprasidone (GEODON) injection 20 mg  20 mg Intramuscular PRN Ardis Hughsoleman, Carolyn H, NP      . magnesium hydroxide (MILK OF MAGNESIA) suspension 30 mL  30 mL Oral Daily PRN Ardis Hughsoleman, Carolyn H, NP      . multivitamin with minerals tablet 1 tablet  1 tablet Oral Daily Antonieta Pertlary, Greg Lawson, MD   1 tablet at 07/27/20 0801  . nicotine polacrilex (NICORETTE) gum 2 mg  2 mg Oral PRN Antonieta Pertlary, Greg Lawson, MD   2 mg at 07/27/20 0815  . traZODone (DESYREL) tablet 50 mg  50 mg Oral QHS PRN Claudie ReveringJames, Martha L, MD   50 mg at 07/26/20 2108   Lab Results:  No results found for this or any previous visit (from the past 48 hour(s)). Blood Alcohol level:  Lab Results  Component Value Date   ETH <10 07/15/2020   ETH <10 01/01/2020   Metabolic Disorder Labs: Lab Results  Component Value Date   HGBA1C 5.5 07/24/2020   MPG 111.15 07/24/2020   No results found for: PROLACTIN Lab Results  Component Value Date   CHOL 248 (H) 07/24/2020   TRIG 150 (H) 07/24/2020   HDL 65 07/24/2020   CHOLHDL 3.8 07/24/2020   VLDL 30 07/24/2020   LDLCALC 153 (H) 07/24/2020   Physical Findings: AIMS: Facial and  Oral Movements Muscles of Facial Expression: None, normal Lips and  Perioral Area: None, normal Jaw: None, normal Tongue: None, normal,Extremity Movements Upper (arms, wrists, hands, fingers): None, normal Lower (legs, knees, ankles, toes): None, normal, Trunk Movements Neck, shoulders, hips: None, normal, Overall Severity Severity of abnormal movements (highest score from questions above): None, normal Incapacitation due to abnormal movements: None, normal Patient's awareness of abnormal movements (rate only patient's report): No Awareness, Dental Status Current problems with teeth and/or dentures?: No Does patient usually wear dentures?: No  CIWA:    COWS:     Musculoskeletal: Strength & Muscle Tone: within normal limits Gait & Station: normal Patient leans: N/A  Psychiatric Specialty Exam:  Presentation  General Appearance: Appropriate for Environment; Casual; Well Groomed  Eye Contact:Good  Speech:Clear and Coherent; Normal Rate  Speech Volume:Normal  Handedness:Right  Mood and Affect  Mood:Anxious; Irritable  Affect:Congruent  Thought Process  Thought Processes:Coherent; Goal Directed  Descriptions of Associations:Intact  Orientation:Full (Time, Place and Person)  Thought Content:Logical; Perseveration (Some perseveration on feeling that her outpatient team has not treated her well)  History of Schizophrenia/Schizoaffective disorder:No  Duration of Psychotic Symptoms:No data recorded Hallucinations:No data recorded  Ideas of Reference:None  Suicidal Thoughts:No data recorded  Homicidal Thoughts:No data recorded  Sensorium  Memory:Immediate Good; Recent Good; Remote Good  Judgment:Poor  Insight:Fair  Executive Functions  Concentration:Fair  Attention Span:Fair  Recall:Fair  Fund of Knowledge:Fair  Language:Good  Psychomotor Activity  Psychomotor Activity:No data recorded  Assets  Assets:Communication Skills; Desire for Improvement; Physical Health; Resilience  Sleep  Sleep:No data  recorded  Physical Exam: Physical Exam Vitals and nursing note reviewed.  HENT:     Head: Normocephalic and atraumatic.     Nose: Nose normal.     Mouth/Throat:     Pharynx: Oropharynx is clear.  Eyes:     Pupils: Pupils are equal, round, and reactive to light.  Cardiovascular:     Rate and Rhythm: Normal rate.     Pulses: Normal pulses.  Pulmonary:     Effort: Pulmonary effort is normal.  Genitourinary:    Comments: Deferred Musculoskeletal:        General: Normal range of motion.     Cervical back: Normal range of motion.  Skin:    General: Skin is warm.  Neurological:     General: No focal deficit present.     Mental Status: She is alert and oriented to person, place, and time.    Review of Systems  Constitutional: Negative.   HENT: Negative.   Eyes: Negative.   Respiratory: Negative.   Cardiovascular: Negative.   Gastrointestinal: Negative.   Genitourinary: Negative.   Skin: Negative for rash.       C/o skin rashes.  Neurological: Negative for dizziness, tingling, tremors, sensory change, speech change, focal weakness, seizures, loss of consciousness, weakness and headaches.  Endo/Heme/Allergies:       See the allergy list.  Psychiatric/Behavioral: Positive for depression and substance abuse (Hx. THC use disorder). Negative for hallucinations, memory loss and suicidal ideas. The patient is nervous/anxious. The patient does not have insomnia.    Blood pressure 130/81, pulse 98, temperature 97.6 F (36.4 C), temperature source Oral, resp. rate 16, height 4\' 10"  (1.473 m), weight 47.2 kg, SpO2 100 %. Body mass index is 21.74 kg/m.  Treatment Plan Summary: Daily contact with patient to assess and evaluate symptoms and progress in treatment and Medication management.  Continue inpatient hospitalization. Will continue today 07/27/2020 plan as below except where  it is noted.  Continue every 15-minute observation status Encourage patient to participate in group  therapy and therapeutic milieu.  Mood symptoms -Continue Lexapro 15 mg daily -Continue Abilify 2 mg daily  Anxiety symptoms -Continue Lexapro 15 mg daily -Continue hydroxyzine 10 mg 3 times daily PRN  Insomnia -Continue trazodone 50 mg at bedtime PRN  Hypertension -Continue atenolol 25 mg daily  UTI -Continue Augmentin 875-125 mg Q12 hours  Muscle spasms -Continue baclofen 10 mg 3 times daily PRN  Social work is working on Architect.  Patient has been provided a list of housing resources and and has been encouraged to call today to start working on housing plan.  I certify that inpatient services furnished can reasonably be expected to improve the patient's condition.  Armandina Stammer, NP, PMHNP, FNP-BC 07/27/2020, 2:44 PMPatient ID: Jasmin Berry, female   DOB: [birth year], 55 y.o.   MRN: [id number] Patient ID: Yianna Tersigni, female   DOB: 17-Oct-1965, 55 y.o.   MRN: [id number]

## 2020-07-27 NOTE — B[facility] Group Notes (Signed)
Adult Psychoeducational Group Not Date:  07/27/2020 Time:  0900-1045 Group Topic/Focus: PROGRESSIVE RELAXATION. A group where deep breathing is taught and tensing and relaxation muscle groups is used. Imagery is used as well.  Pts are asked to imagine 3 pillars that hold them up when they are not able to hold themselves up.  Participation Level:  Active  Participation Quality:  Appropriate  Affect:  Appropriate  Cognitive:  Oriented  Insight: Improving  Engagement in Group:  Engaged  Modes of Intervention:  Activity, Discussion, Education, and Support  Additional Comments:  Rates her energy at a 7.5/10. States God, Jesus and herself hold her up.  Dione Housekeeper

## 2020-07-28 MED ORDER — ATENOLOL 25 MG PO TABS: 25.0000 mg | ORAL_TABLET | Freq: Every day | ORAL | 0 refills | Status: AC

## 2020-07-28 MED ORDER — ARIPIPRAZOLE 2 MG PO TABS: 2.0000 mg | ORAL_TABLET | Freq: Every day | ORAL | 0 refills | Status: AC

## 2020-07-28 MED ORDER — AMOXICILLIN-POT CLAVULANATE 875-125 MG PO TABS: 1.0000 | ORAL_TABLET | Freq: Two times a day (BID) | ORAL | 0 refills | Status: AC

## 2020-07-28 MED ORDER — ESCITALOPRAM OXALATE 5 MG PO TABS: 15.0000 mg | ORAL_TABLET | Freq: Every day | ORAL | 0 refills | Status: AC

## 2020-07-28 MED ORDER — HYDROCORTISONE 1 % EX CREA: TOPICAL_CREAM | Freq: Four times a day (QID) | CUTANEOUS | 0 refills | Status: AC | PRN

## 2020-07-28 NOTE — Progress Notes (Signed)
Spiritual care group on grief and loss facilitated by chaplain Burnis Kingfisher MDiv, [facility]  Group Goal:  Support / Education around grief and loss Members engage in facilitated group support and psycho-social education.  Group Description:  Following introductions and group rules, group members engaged in facilitated group dialog and support around topic of loss, with particular support around experiences of loss in their lives. Group Identified types of loss (relationships / self / things) and identified patterns, circumstances, and changes that precipitate losses. Reflected on thoughts / feelings around loss, normalized grief responses, and recognized variety in grief experience.   Group noted Worden's four tasks of grief in discussion.  Group drew on Adlerian / Rogerian, narrative, MI, Patient Progress:  Pt present in group.  Left room frequently and returned.  Got up from group circle and spent time at window when another group member discharged.  Toward end of group, Jasmin Berry asked group, "what do you do when you know you're not ok"   Attempted to clarify and solicit details.  Jasmin Berry was not specific.  Group response focused around connection to resources.

## 2020-07-28 NOTE — BHH Counselor (Signed)
CSW spoke with Tobi Bastos at [facility] who states that they have another bed at a shelter in [place] but the patient has told her that she will only accept [facility] house at this time.  Tobi Bastos states that they will contact [facility] but they do not have anything else to offer her.  CSW spoke with Misty Stanley who had stated earlier this morning that she would go to the [facility] and speak with a case manager about house.  Ikhlas states now that she would like to leave at 75 with a friend and plans to go to [facility] to speak with them about housing options.  Dhalia tells this CSW that "I could act out not and scream and yell and threaten to kill myself and yall would have to keep me but I am not going to do that".  Gwyn also states that she could go to [address] in [place] and hurt someone or herself so the hospital would have to take her back.  CSW has explained to [place] that the doctor believes that she is stable and that she cannot live at the hospital because it is short term and stabilization.  Janeil has spoken with CSW and the doctor and stated that she believes her medications are working well and that she does not feel suicidal "as long as you let me stay in the hospital but if I leave I will be suicidal again".  Amylah asked CSW about long term residential treatment for mental health that would house her indefinitely.  CSW explained that there are no options for that type of treatment.  Gennette states that she is angry at the doctor and CSW for the discharge but is willing to leave at this time.
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
## 2020-07-28 NOTE — Progress Notes (Signed)
Recreation Therapy Notes  Date:  5.9.22 Time: 0930 Location: [address] Dayroom  Group Topic: Stress Management  Goal Area(s) Addresses:  Patient will identify positive stress management techniques. Patient will identify benefits of using stress management post d/c.  Behavioral Response: Engaged, Attentive  Intervention: Stress Management  Activity :  Meditation.  LRT played a meditation that focused on the importance of self esteem.  Patients were to listen and follow along as the meditation played to fully engage in meditation.    Education:  Stress Management, Discharge Planning.   Education Outcome: Acknowledges Education  Clinical Observations/Feedback: Pt attended group session.    Jasmin Berry, LRT/CTRS         Jasmin Berry A 07/28/2020 11:04 AM 

## 2020-07-28 NOTE — Progress Notes (Signed)
D: Pt A & O X 4. Denies SI, HI, AVH and pain at this time. D/C home as ordered. Presents restless / fidgety and hyper-verbal on interactions. Siting in lobby charging her phone. A: D/C instructions reviewed with pt including prescriptions, medication samples and follow up appointments; compliance encouraged. All belongings from locker 22 returned to pt at time of departure. Scheduled and PRN medications given with verbal education and effects monitored. Safety checks maintained without incident till time of d/c.  R: Pt receptive to care. Compliant with medications when offered. Denies adverse drug reactions when assessed. Verbalized understanding related to d/c instructions. Signed belonging sheet in agreement with items received from locker. Ambulatory with a steady gait. Appears to be in no physical distress at time of departure.

## 2020-07-28 NOTE — Progress Notes (Signed)
  [facility] Adult Case Management Discharge Plan :  Will you be returning to the same living situation after discharge:  No. Shelter At discharge, do you have transportation home?: Yes,  Safe Transport Do you have the ability to pay for your medications: Yes,  Community Support  Release of information consent forms completed and in the chart;  Patient's signature needed at discharge.  Patient to Follow up at:  Follow-up Information    [facility], [facility]. Call.   Why: Please call for a re-entry hospital follow up appointment for CST therapy and medication management services.  Contact information: [address] [place] [address] [phone]        [place]. Go on 07/28/2020.   Specialty: Professional Counselor Why: You have an appointment for medication management services on 07/28/20 at 1:30 pm.  You also have an appointment for therapy on 08/04/20 at 12:00 pm.  This appointment will be held in person. Contact information: [address] [place] [address] [phone]               Next level of care provider has access to [facility] Link:no  Safety Planning and Suicide Prevention discussed: Yes,  with patient and [facility] provider  Have you used any form of tobacco in the last 30 days? (Cigarettes, Smokeless Tobacco, Cigars, and/or Pipes): Yes  Has patient been referred to the Quitline?: Patient refused referral  Patient has been referred for addiction treatment: N/A  Aram Beecham, LCSWA 07/28/2020, 10:10 AM
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
## 2020-09-04 ENCOUNTER — Emergency Department (HOSPITAL_COMMUNITY)
Admission: EM | Admit: 2020-09-04 | Discharge: 2020-09-06 | Disposition: A | Discharge status: Home / Self Care | Payer: Self-pay | Attending: Emergency Medicine | Admitting: Emergency Medicine

## 2020-09-04 ENCOUNTER — Encounter (HOSPITAL_COMMUNITY): Payer: Self-pay

## 2020-09-04 ENCOUNTER — Other Ambulatory Visit: Payer: Self-pay

## 2020-09-04 DIAGNOSIS — U071 COVID-19: Secondary | ICD-10-CM | POA: Insufficient documentation

## 2020-09-04 DIAGNOSIS — S50812A Abrasion of left forearm, initial encounter: Secondary | ICD-10-CM | POA: Insufficient documentation

## 2020-09-04 DIAGNOSIS — F4320 Adjustment disorder, unspecified: Secondary | ICD-10-CM | POA: Diagnosis present

## 2020-09-04 DIAGNOSIS — X58XXXA Exposure to other specified factors, initial encounter: Secondary | ICD-10-CM | POA: Insufficient documentation

## 2020-09-04 DIAGNOSIS — R000 Tachycardia, unspecified: Secondary | ICD-10-CM | POA: Insufficient documentation

## 2020-09-04 DIAGNOSIS — F314 Bipolar disorder, current episode depressed, severe, without psychotic features: Secondary | ICD-10-CM | POA: Insufficient documentation

## 2020-09-04 DIAGNOSIS — I10 Essential (primary) hypertension: Secondary | ICD-10-CM | POA: Insufficient documentation

## 2020-09-04 DIAGNOSIS — R45851 Suicidal ideations: Secondary | ICD-10-CM

## 2020-09-04 DIAGNOSIS — Y900 Blood alcohol level of less than 20 mg/100 ml: Secondary | ICD-10-CM | POA: Insufficient documentation

## 2020-09-04 DIAGNOSIS — Z79899 Other long term (current) drug therapy: Secondary | ICD-10-CM | POA: Insufficient documentation

## 2020-09-04 DIAGNOSIS — F17210 Nicotine dependence, cigarettes, uncomplicated: Secondary | ICD-10-CM | POA: Insufficient documentation

## 2020-09-04 DIAGNOSIS — S50811A Abrasion of right forearm, initial encounter: Secondary | ICD-10-CM | POA: Insufficient documentation

## 2020-09-04 LAB — COMPREHENSIVE METABOLIC PANEL
ALT: 16 U/L (ref 0–44)
AST: 24 U/L (ref 15–41)
Albumin: 4 g/dL (ref 3.5–5.0)
Alkaline Phosphatase: 96 U/L (ref 38–126)
Anion gap: 12 (ref 5–15)
BUN: 20 mg/dL (ref 6–20)
CO2: 23 mmol/L (ref 22–32)
Calcium: 8.9 mg/dL (ref 8.9–10.3)
Chloride: 102 mmol/L (ref 98–111)
Creatinine, Ser: 0.94 mg/dL (ref 0.44–1.00)
GFR, Estimated: >60 mL/min (ref >60)
Glucose, Bld: 128 mg/dL — ABNORMAL HIGH (ref 70–99)
Potassium: 4.3 mmol/L (ref 3.5–5.1)
Sodium: 137 mmol/L (ref 135–145)
Total Bilirubin: 0.2 mg/dL — ABNORMAL LOW (ref 0.3–1.2)
Total Protein: 7.9 g/dL (ref 6.5–8.1)

## 2020-09-04 LAB — RAPID URINE DRUG SCREEN, HOSP PERFORMED
Amphetamines: NOT DETECTED
Barbiturates: NOT DETECTED
Benzodiazepines: NOT DETECTED
Cocaine: NOT DETECTED
Opiates: NOT DETECTED
Tetrahydrocannabinol: POSITIVE — AB

## 2020-09-04 LAB — CBC WITH DIFFERENTIAL/PLATELET
Abs Immature Granulocytes: 0.01 10*3/uL (ref 0.00–0.07)
Basophils Absolute: 0 10*3/uL (ref 0.0–0.1)
Basophils Relative: 0 %
Eosinophils Absolute: 0.1 10*3/uL (ref 0.0–0.5)
Eosinophils Relative: 1 %
HCT: 43.1 % (ref 36.0–46.0)
Hemoglobin: 14.1 g/dL (ref 12.0–15.0)
Immature Granulocytes: 0 %
Lymphocytes Relative: 24 %
Lymphs Abs: 1.4 10*3/uL (ref 0.7–4.0)
MCH: 30.2 pg (ref 26.0–34.0)
MCHC: 32.7 g/dL (ref 30.0–36.0)
MCV: 92.3 fL (ref 80.0–100.0)
Monocytes Absolute: 0.4 10*3/uL (ref 0.1–1.0)
Monocytes Relative: 8 %
Neutro Abs: 3.9 10*3/uL (ref 1.7–7.7)
Neutrophils Relative %: 67 %
Platelets: 219 10*3/uL (ref 150–400)
RBC: 4.67 MIL/uL (ref 3.87–5.11)
RDW: 12.9 % (ref 11.5–15.5)
WBC: 5.9 10*3/uL (ref 4.0–10.5)
nRBC: 0 % (ref 0.0–0.2)

## 2020-09-04 LAB — ACETAMINOPHEN LEVEL: Acetaminophen (Tylenol), Serum: <10 ug/mL — ABNORMAL LOW (ref 10–30)

## 2020-09-04 LAB — RESP PANEL BY RT-PCR (FLU A&B, COVID) ARPGX2
Influenza A by PCR: NEGATIVE
Influenza B by PCR: NEGATIVE
SARS Coronavirus 2 by RT PCR: POSITIVE — AB

## 2020-09-04 LAB — SALICYLATE LEVEL: Salicylate Lvl: <7 mg/dL — ABNORMAL LOW (ref 7.0–30.0)

## 2020-09-04 LAB — ETHANOL: Alcohol, Ethyl (B): <10 mg/dL (ref <10)

## 2020-09-04 LAB — HCG, QUANTITATIVE, PREGNANCY: hCG, Beta Chain, Quant, S: 3 m[IU]/mL (ref <5)

## 2020-09-04 NOTE — ED Notes (Addendum)
Pt belonging stored in [facility] Dayroom (E29). 2 suitcases (a carry-on and large), a large tote bag (w/ smaller tote bag inside and smaller purse) , and 3 pt belonging bags: dress, purse ( jewelry inside a clear plastic bag inside), wedges and pillow.
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
## 2020-09-04 NOTE — ED Triage Notes (Signed)
Per [facility], states friends brought her here for increased anxiety and panic attacks-states she is suicidal and homicidal thoughts-

## 2020-09-04 NOTE — ED Notes (Signed)
Pt ambulatory to bathroom. Urine sample provided
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
## 2020-09-05 DIAGNOSIS — F4320 Adjustment disorder, unspecified: Secondary | ICD-10-CM | POA: Diagnosis present

## 2020-09-05 MED ORDER — METOCLOPRAMIDE HCL 10 MG PO TABS
10.0000 mg | ORAL_TABLET | Freq: Once | ORAL | Status: AC
  Administered 2020-09-05: 10 mg via ORAL
  Filled 2020-09-05: qty 1

## 2020-09-05 MED ORDER — GABAPENTIN 300 MG PO CAPS
300.0000 mg | ORAL_CAPSULE | Freq: Three times a day (TID) | ORAL | Status: DC
  Administered 2020-09-05 (×2): 300 mg via ORAL
  Filled 2020-09-05 (×2): qty 1

## 2020-09-05 NOTE — ED Notes (Signed)
Report given to Florentina Addison, Charity fundraiser.  Patient moved to room 28.

## 2020-09-05 NOTE — ED Provider Notes (Addendum)
Emergency Medicine Observation Re-evaluation Note  Jasmin Berry is a 55 y.o. female, seen on rounds today.  Pt initially presented to the ED for complaints of Suicidal Currently, the patient is in bed. She is trying to rest but complains about the overhead light. I was unable to adjust it and still accommodate the sitter who needs to see in her room. We will try to find a sleep mask.  Physical Exam  BP 112/63   Pulse 85   Temp 98.7 F (37.1 C) (Oral)   Resp 18   SpO2 98%  Physical Exam General: no distress Lungs: no distress Psych: cooperative  ED Course / MDM  EKG:   I have reviewed the labs performed to date as well as medications administered while in observation.  Recent changes in the last 24 hours include tested positive for COVID which made it impossible to be transferred to [facility].  Plan  Current plan is for [facility] reassessment. Patient is not under full IVC at this time.   Koleen Distance, MD 09/05/20 [id number]    Koleen Distance, MD 09/05/20 [phone]  Update at 13:04 Raphael Gibney has been cleared from a psychiatric standpoint and will be discharged home.   Koleen Distance, MD 09/05/20 1304   13:54: Dispo has changed again and patient will be evaluated by psychiatry in the morning.   Koleen Distance, MD 09/05/20 [phone]

## 2020-09-05 NOTE — Discharge Instructions (Addendum)
For your behavioral health needs you are advised to follow up with one of he providers listed below at your earliest opportunity:  IN THE [place] AREA:       [facility]      [address]      [place], [place] [address]      [phone]      They offer psychiatry/medication management, therapy and substance abuse treatment.  New patients are seen in their walk-in clinic.  Walk-in hours are Monday - Thursday from 8:00 am - 11:00 am for psychiatry, and Friday from 1:00 pm - 4:00 pm for therapy.  Walk-in patients are seen on a first come, first served basis, so try to arrive as early as possible for the best chance of being seen the same day.       [facility] [place]      [address]      [place], [place] [address]      [phone]      New and returning patients are seen at their walk-in clinic.  Walk-in hours are Monday - Friday from 8:30 am - 12:00 pm, and from 1:00 pm - 2:30 pm.  Walk-in patients are seen on a first come, first served basis, so try to arrive as early as possible for the best chance of being seen the same day.  IN THE [place] AREA:       [facility]      [address]      [place], [place] [address]       [phone]

## 2020-09-05 NOTE — BH Assessment (Addendum)
[facility] Assessment Progress Note   Per Maxie Barb, NP, this voluntary pt does not require psychiatric hospitalization at this time.  Pt is psychiatrically cleared.  Discharge instructions include referral information for [facility], for [facility] [place] and for [facility] in [place].  A TOC consult has been ordered to address pt's psychosocial needs.  EDP Pieter Partridge, MD and pt's nurse, Florentina Addison, have been notified, as well as Ricquita Tarpley-Carter, LCSWA  Doylene Canning, MA Triage Specialist [phone]   Addendum:  Per Nehemiah Settle, NP, after further consideration pt is to be held overnight at [facility] for further observation and stabilization.  Pt is not eligible for transfer to [facility] at this time due to testing positive for Covid-19.  Dr Delford Field, Florentina Addison and Ricquita have been informed.  Doylene Canning, [place] Behavioral Health Coordinator [phone]

## 2020-09-05 NOTE — Consult Note (Signed)
Telepsych Consultation   Reason for Consult:  complains of si/hi/avh. hx of same. hx borderline personality disorder. homeless Referring Physician:  Elvera MariaGreen, Garrett L, PA-C [id number] Location of Provider: Behavioral Health TTS Department  Patient Identification: Jasmin GibneyLisa Berry MRN:  [id number] Principal Diagnosis: Adjustment disorder Diagnosis:  Principal Problem:   Adjustment disorder   Total Time spent with patient: 20 minutes  Subjective:   Jasmin Berry is a 55 y.o. female patient admitted with suicidal ideations.  Patient presents alert and oriented, calm and cooperative in bed.   Patient states she was recently at [employer] (work [facility] weeks [place], [place] says she left there because she "didn't like it". Says she called a friend to come and get her who in turn dropped her off at the gas station. Per chart review, patient was living at the [facility] [place] where she left voluntarily. Reports most recently she was living in [place], [place] with friend when she asked his son to drop her off "here"; she then mentions her "A & B team" said she needs hospitalization. Denies any ACTT or current outpatient services. Per chart review patient has left the last two placements ([facility], [employer]) and has been homeless.   Provider discussed at length inpatient hospitalization criteria and her not currently meeting criteria; attempted to discuss outpatient supports and psychosocial stressors. Provider discussed patient getting [facility] and following up with treatment. Patient did become upset stating she is "ready to leave" and ended the assessment. Per chart review, 09/04/20 "during her most recent psychiatric hospitalization that she knows that she can be admitted if she claims she is suicidal in order to avoid discharge back into homelessness.  She said while admitted "I could act out, scream, and threaten to kill myself and  you would have to keep me here."  Patient denies any active suicidal or homicidal ideations; she does endorse and per chart review has a history of chronic suicidal ideations. She denies any auditory or visual hallucinations and does not appear to be actively psychotic or responding to any external/internal stimuli at this time. TOC consult placed to assist patient with resources to address psychosocial needs. Patient is COVID+.   1345: Provider notified patient made several statements to harm herself to TOC/SW Ricquita. Patient was reviewed with Attending MD, Lucianne MussKumar; medications to be initiated and patient to remain on continuous observation. Will be re-evaluated by psychiatry in the morning.   Per [facility] summary 07/28/20:  "She recently lost her apartment due to non payment of rent for the past 2 years. She is now homeless and was most recently staying at [facility] [place]. She did not want to stay there and [facility] after saying she was suicidal. She made a comment to the triage RN on her arrival to the ED that she knows how this works will say she will kill herself so she can stay in the hospital for a while (see RN's note on 07/22/20 at 6:25PM). Patient has continued to say these type of things while at [facility] whenever the subject of discharge is approached. She has a high degree of secondary gain by seeking shelter in the hospital until someone secures her housing. She also has the probability of doing some type of self-harm gesture in order to gain access to an inpatient admission instead of going to a shelter.  When providers approach her she starts saying she is SI/HI  because she does not want to be discharged. She does not mention intent or plan when voicing her SI/HI. This morning, she told Melba Coon, CSW (see note in chart), that "I could act out, scream and yell and threaten to kill myself and you would have to keep me here."  Per EDRN Note 07/22/20 1825:  "Pt  stated to writer upon her arrival that she knew how this works and is going to say she will kill herself so she can come here to stay a while. MD aware."  HPI:   Jasmin Berry is a 54 year old female with a past history of borderline personality disorder, PTSD, MDD, and adjustment disorder who presented to [facility] with complaints of suicidal ideations. Per chart reviewed patient was most recently discharged from [facility] 07/28/20 for recent presentation.  Past Psychiatric History:   -Borderline Personality Disorder  -PTSD  -Major Depressive Disorder, Recurrent severe w/out  psychotic features  -Adjustment Disorder  Risk to Self:   Risk to Others:   Prior Inpatient Therapy:   Prior Outpatient Therapy:    Past Medical History:  Past Medical History:  Diagnosis Date   Hypertension    PTSD (post-traumatic stress disorder)    Renal disorder    Vertigo    History reviewed. No pertinent surgical history. Family History: History reviewed. No pertinent family history. Family Psychiatric  History: not noted Social History:  Social History   Substance and Sexual Activity  Alcohol Use No     Social History   Substance and Sexual Activity  Drug Use Yes   Comment: CBD    Social History   Socioeconomic History   Marital status: Single    Spouse name: Not on file   Number of children: Not on file   Years of education: Not on file   Highest education level: Not on file  Occupational History   Not on file  Tobacco Use   Smoking status: Every Day    Packs/day: 1.00    Pack years: 0.00    Types: Cigarettes   Smokeless tobacco: Never  Vaping Use   Vaping Use: Every day  Substance and Sexual Activity   Alcohol use: No   Drug use: Yes    Comment: CBD   Sexual activity: Not on file  Other Topics Concern   Not on file  Social History Narrative   Not on file   Social Determinants of Health   Financial Resource Strain: Not on file  Food Insecurity: Not on file  Transportation  Needs: Not on file  Physical Activity: Not on file  Stress: Not on file  Social Connections: Not on file   Additional Social History:    Allergies:   Allergies  Allergen Reactions   Sulfa Antibiotics Rash   Yeast-Related Products Rash    Labs:  Results for orders placed or performed during the hospital encounter of 09/04/20 (from the past 48 hour(s))  Comprehensive metabolic panel     Status: Abnormal   Collection Time: 09/04/20  6:28 PM  Result Value Ref Range   Sodium 137 135 - 145 mmol/L   Potassium 4.3 3.5 - 5.1 mmol/L   Chloride 102 98 - 111 mmol/L   CO2 23 22 - 32 mmol/L   Glucose, Bld 128 (H) 70 - 99 mg/dL    Comment: Glucose reference range applies only to samples taken after fasting for at least 8 hours.   BUN 20 6 - 20 mg/dL   Creatinine, Ser 1.61 0.44 -  1.00 mg/dL   Calcium 8.9 8.9 - 01.0 mg/dL   Total Protein 7.9 6.5 - 8.1 g/dL   Albumin 4.0 3.5 - 5.0 g/dL   AST 24 15 - 41 U/L   ALT 16 0 - 44 U/L   Alkaline Phosphatase 96 38 - 126 U/L   Total Bilirubin 0.2 (L) 0.3 - 1.2 mg/dL   GFR, Estimated >27 >25 mL/min    Comment: (NOTE) Calculated using the CKD-EPI Creatinine Equation (2021)    Anion gap 12 5 - 15    Comment: Performed at [facility], [address] [place], [place] [address]  Ethanol     Status: None   Collection Time: 09/04/20  6:28 PM  Result Value Ref Range   Alcohol, Ethyl (B) <10 <10 mg/dL    Comment: (NOTE) Lowest detectable limit for serum alcohol is 10 mg/dL.  For medical purposes only. Performed at [facility], [address] [place], [place] [address]   CBC with Diff     Status: None   Collection Time: 09/04/20  6:28 PM  Result Value Ref Range   WBC 5.9 4.0 - 10.5 K/uL   RBC 4.67 3.87 - 5.11 MIL/uL   Hemoglobin 14.1 12.0 - 15.0 g/dL   HCT 25.9 56.3 - 87.5 %   MCV 92.3 80.0 - 100.0 fL   MCH 30.2 26.0 - 34.0 pg   MCHC 32.7 30.0 - 36.0 g/dL   RDW 64.3 32.9 - 51.8 %   Platelets 219 150 -  400 K/uL   nRBC 0.0 0.0 - 0.2 %   Neutrophils Relative % 67 %   Neutro Abs 3.9 1.7 - 7.7 K/uL   Lymphocytes Relative 24 %   Lymphs Abs 1.4 0.7 - 4.0 K/uL   Monocytes Relative 8 %   Monocytes Absolute 0.4 0.1 - 1.0 K/uL   Eosinophils Relative 1 %   Eosinophils Absolute 0.1 0.0 - 0.5 K/uL   Basophils Relative 0 %   Basophils Absolute 0.0 0.0 - 0.1 K/uL   Immature Granulocytes 0 %   Abs Immature Granulocytes 0.01 0.00 - 0.07 K/uL    Comment: Performed at [facility], [address] [place], [place] [address]  Salicylate level     Status: Abnormal   Collection Time: 09/04/20  6:28 PM  Result Value Ref Range   Salicylate Lvl <7.0 (L) 7.0 - 30.0 mg/dL    Comment: Performed at [facility] [address], [place], [place] [address]  Acetaminophen level     Status: Abnormal   Collection Time: 09/04/20  6:28 PM  Result Value Ref Range   Acetaminophen (Tylenol), Serum <10 (L) 10 - 30 ug/mL    Comment: (NOTE) Therapeutic concentrations vary significantly. A range of 10-30 ug/mL  may be an effective concentration for many patients. However, some  are best treated at concentrations outside of this range. Acetaminophen concentrations >150 ug/mL at 4 hours after ingestion  and >50 ug/mL at 12 hours after ingestion are often associated with  toxic reactions.  Performed at [facility], [address], [place], [place] [address]   hCG, quantitative, pregnancy     Status: None   Collection Time: 09/04/20  6:28 PM  Result Value Ref Range   hCG, Beta Chain, Quant, S 3 <5 mIU/mL    Comment:          GEST. AGE      CONC.  (mIU/mL)   <=1 WEEK  5 - 50     2 WEEKS       50 - 500     3 WEEKS       100 - 10,000     4 WEEKS     1,000 - 30,000     5 WEEKS     3,500 - 115,000   6-8 WEEKS     12,000 - 270,000    12 WEEKS     15,000 - 220,000        FEMALE AND NON-PREGNANT FEMALE:     LESS THAN 5 mIU/mL Performed at [facility], [address], [place], [place] [address]   Resp Panel by RT-PCR (Flu A&B, Covid) Nasopharyngeal Swab     Status: Abnormal   Collection Time: 09/04/20  6:31 PM   Specimen: Nasopharyngeal Swab; Nasopharyngeal(NP) swabs in vial transport medium  Result Value Ref Range   SARS Coronavirus 2 by RT PCR POSITIVE (A) NEGATIVE    Comment: RESULT CALLED TO, READ BACK BY AND VERIFIED WITH: HAILEY, RN @ 2226 ON 09/04/20 C VARNER (NOTE) SARS-CoV-2 target nucleic acids are DETECTED.  The SARS-CoV-2 RNA is generally detectable in upper respiratory specimens during the acute phase of infection. Positive results are indicative of the presence of the identified virus, but do not rule out bacterial infection or co-infection with other pathogens not detected by the test. Clinical correlation with patient history and other diagnostic information is necessary to determine patient infection status. The expected result is Negative.  Fact Sheet for Patients: BloggerCourse.com  Fact Sheet for Healthcare Providers: SeriousBroker.it  This test is not yet approved or cleared by the [place] FDA and  has been authorized for detection and/or diagnosis of SARS-CoV-2 by FDA under an Emergency Use Authorization (EUA).  This EUA will remain in effect (meaning this test ca n be used) for the duration of  the COVID-19 declaration under Section 564(b)(1) of the Act, 21 U.S.C. section 360bbb-3(b)(1), unless the authorization is terminated or revoked sooner.     Influenza A by PCR NEGATIVE NEGATIVE   Influenza B by PCR NEGATIVE NEGATIVE    Comment: (NOTE) The Xpert Xpress SARS-CoV-2/FLU/RSV plus assay is intended as an aid in the diagnosis of influenza from Nasopharyngeal swab specimens and should not be used as a sole basis for treatment. Nasal washings and aspirates are unacceptable for Xpert Xpress SARS-CoV-2/FLU/RSV testing.  Fact  Sheet for Patients: BloggerCourse.com  Fact Sheet for Healthcare Providers: SeriousBroker.it  This test is not yet approved or cleared by the [place] FDA and has been authorized for detection and/or diagnosis of SARS-CoV-2 by FDA under an Emergency Use Authorization (EUA). This EUA will remain in effect (meaning this test can be used) for the duration of the COVID-19 declaration under Section 564(b)(1) of the Act, 21 U.S.C. section 360bbb-3(b)(1), unless the authorization is terminated or revoked.  Performed at [facility], [address], [place], [place] [address]   Urine rapid drug screen (hosp performed)     Status: Abnormal   Collection Time: 09/04/20  7:37 PM  Result Value Ref Range   Opiates NONE DETECTED NONE DETECTED   Cocaine NONE DETECTED NONE DETECTED   Benzodiazepines NONE DETECTED NONE DETECTED   Amphetamines NONE DETECTED NONE DETECTED   Tetrahydrocannabinol POSITIVE (A) NONE DETECTED   Barbiturates NONE DETECTED NONE DETECTED    Comment: (NOTE) DRUG SCREEN FOR MEDICAL PURPOSES ONLY.  IF CONFIRMATION IS NEEDED FOR ANY PURPOSE, NOTIFY LAB WITHIN 5  DAYS.  LOWEST DETECTABLE LIMITS FOR URINE DRUG SCREEN Drug Class                     Cutoff (ng/mL) Amphetamine and metabolites    1000 Barbiturate and metabolites    200 Benzodiazepine                 200 Tricyclics and metabolites     300 Opiates and metabolites        300 Cocaine and metabolites        300 THC                            50 Performed at [facility], [address] [place], [place] [address]     Medications:  No current facility-administered medications for this encounter.   Current Outpatient Medications  Medication Sig Dispense Refill   atenolol (TENORMIN) 25 MG tablet Take 1 tablet (25 mg total) by mouth daily. 30 tablet 0   baclofen (LIORESAL) 10 MG tablet Take 20 mg by mouth 2 (two) times  daily.     diphenhydrAMINE (BENADRYL) 25 MG tablet Take 100 mg by mouth every 6 (six) hours as needed.     escitalopram (LEXAPRO) 5 MG tablet Take 3 tablets (15 mg total) by mouth daily. 30 tablet 0   hydrocortisone cream 1 % Apply topically 4 (four) times daily as needed for itching. 30 g 0   Magnesium Oxide 500 MG CAPS Take 1 capsule by mouth daily.     Omega-3 Fatty Acids (FISH OIL) 1000 MG CAPS Take 1 capsule by mouth daily.     Turmeric (QC TUMERIC COMPLEX PO) Take 1 tablet by mouth daily.     amoxicillin-clavulanate (AUGMENTIN) 875-125 MG tablet Take 1 tablet by mouth every 12 (twelve) hours. (Patient not taking: Reported on 09/04/2020) 8 tablet 0   ARIPiprazole (ABILIFY) 2 MG tablet Take 1 tablet (2 mg total) by mouth daily. (Patient not taking: No sig reported) 30 tablet 0   Musculoskeletal: Strength & Muscle Tone: within normal limits Gait & Station: normal Patient leans: N/A  Psychiatric Specialty Exam:  Presentation  General Appearance: Appropriate for Environment; Casual; Fairly Groomed  Eye Contact:Good  Speech:Clear and Coherent; Normal Rate  Speech Volume:Normal  Handedness:Right  Mood and Affect  Mood:Irritable (Patient is homeless and does not want to be discharged. She is seeking shelter in the hospital until housing can be obtained. She is discharging to [facility].)  Affect:Congruent  Thought Process  Thought Processes:Coherent; Goal Directed  Descriptions of Associations:Intact  Orientation:Full (Time, Place and Person)  Thought Content:Logical  History of Schizophrenia/Schizoaffective disorder:No  Duration of Psychotic Symptoms:Less than six months  Hallucinations:No data recorded Ideas of Reference:None  Suicidal Thoughts:No data recorded Homicidal Thoughts:No data recorded  Sensorium  Memory:Immediate Good; Recent Good; Remote Fair  Judgment:Fair  Insight:Fair  Executive Functions  Concentration:Fair  Attention  Span:Fair  Recall:Fair  Fund of Knowledge:Fair  Language:Good  Psychomotor Activity  Psychomotor Activity: No data recorded  Assets  Assets:Communication Skills; Leisure Time; Resilience; Physical Health  Sleep  Sleep: No data recorded  Physical Exam: Physical Exam Vitals and nursing note reviewed.  Constitutional:      General: She is not in acute distress.    Appearance: Normal appearance. She is not ill-appearing or toxic-appearing.  HENT:     Head: Normocephalic.     Nose: Nose normal.  Pulmonary:     Effort: Pulmonary  effort is normal.  Musculoskeletal:     Cervical back: Normal range of motion.  Neurological:     General: No focal deficit present.     Mental Status: She is alert and oriented to person, place, and time. Mental status is at baseline.  Psychiatric:        Attention and Perception: Attention and perception normal.        Mood and Affect: Affect is blunt.        Speech: Speech normal.        Behavior: Behavior normal. Behavior is cooperative.        Thought Content: Thought content normal.        Cognition and Memory: Cognition and memory normal.        Judgment: Judgment normal.     Comments: Chronic suicidality   Review of Systems  Psychiatric/Behavioral:  Positive for substance abuse.   All other systems reviewed and are negative. Blood pressure 115/85, pulse 91, temperature 98.7 F (37.1 C), temperature source Oral, resp. rate 18, SpO2 97 %. There is no height or weight on file to calculate BMI.  Treatment Plan Summary: Plan  [facility] consult placed for assistance with resources to address psychsocial needs.  Medications re-started. Patient to remain on continuous observation for safety and stabilization; re-evaluated by psychiatry in the morning.   Disposition: Supportive therapy provided about ongoing stressors. Discussed crisis plan, support from social network, calling 911, coming to the Emergency Department, and calling Suicide  Hotline. Patient to remain in continuous observation; to be re-evaluated by psychiatry in the morning.   This service was provided via telemedicine using a 2-way, interactive audio and video technology.  Names of all persons participating in this telemedicine service and their role in this encounter. Name: Maxie Barb Role: PMHNP  Name: Nelly Rout Role: Attending MD  Name: Jasmin Berry Role: patient  Name:  Role:     Loletta Parish, NP 09/05/2020 11:26 AM

## 2020-09-05 NOTE — Progress Notes (Signed)
..  Transition of Care [facility]) - Emergency Department Mini Assessment   Patient Details  Name: Jasmin Berry MRN: [id number] Date of Birth: Dec 09, 1965  Transition of [facility]) CM/SW Contact:    Narada Uzzle C Tarpley-Carter, LCSWA Phone Number: 09/05/2020, 3:07 PM   Clinical Narrative: [facility] CSW consulted with pt in regards to shelter needs.  Pt is agitated that she was not admitted.  Pt stated, "Once I'm released, I'm gonna go get hit by a bus. That's the easiest way to go.  Just step out in front of a bus."  Pt also state that no one cares about her and she wants to end it all.  CSW will not advise to dc pt as she presents SI with a plan at the time of consult.  CSW asked Florentina Addison, RN to ask for a re-consult from TTS.    [place] Nabor Tarpley-Carter, MSW, LCSW-A Pronouns:  She, Her, Hers                  [facility] Transitions of CareClinical Social Worker [email or web address] [phone]    ED Mini Assessment: What brought you to the Emergency Department? : (P) SI and HI, Anxiety  Barriers to Discharge: (P) No Barriers Identified        Interventions which prevented an admission or readmission: Homeless Screening    Patient Contact and Communications        ,                 Admission diagnosis:  Mental Health Eval Patient Active Problem List   Diagnosis Date Noted   Adjustment disorder 09/05/2020   Major depressive disorder, recurrent severe without psychotic features (HCC) 07/23/2020   Suicidal ideation 07/16/2020   PCP:  Dartha Lodge, FNP Pharmacy:   [facility] [id number] - [address] [place] - [address] [place] [address] Phone: [phone] Fax: [phone]

## 2020-09-05 NOTE — ED Notes (Signed)
I Ichael Pullara witness the pt telling me that if they discharge me that she will go run in front of traffic or she will go hang herself, due to being discharge, she say she came her for help and she don't feel safe

## 2020-09-06 ENCOUNTER — Encounter (HOSPITAL_COMMUNITY): Payer: Self-pay | Admitting: *Deleted

## 2020-09-06 ENCOUNTER — Other Ambulatory Visit: Payer: Self-pay

## 2020-09-06 ENCOUNTER — Emergency Department (HOSPITAL_COMMUNITY)
Admission: EM | Admit: 2020-09-06 | Discharge: 2020-09-07 | Disposition: A | Discharge status: Home / Self Care | Payer: Medicaid Other | Attending: Emergency Medicine | Admitting: Emergency Medicine

## 2020-09-06 DIAGNOSIS — Z5900 Homelessness unspecified: Secondary | ICD-10-CM | POA: Insufficient documentation

## 2020-09-06 DIAGNOSIS — U071 COVID-19: Secondary | ICD-10-CM | POA: Insufficient documentation

## 2020-09-06 DIAGNOSIS — I10 Essential (primary) hypertension: Secondary | ICD-10-CM | POA: Insufficient documentation

## 2020-09-06 DIAGNOSIS — Z79899 Other long term (current) drug therapy: Secondary | ICD-10-CM | POA: Insufficient documentation

## 2020-09-06 DIAGNOSIS — F17210 Nicotine dependence, cigarettes, uncomplicated: Secondary | ICD-10-CM | POA: Insufficient documentation

## 2020-09-06 NOTE — Care Management (Signed)
Difficult situation as patient is COVID positive, cannot stay in shelters due to this, COVID hotel no longer in service. Limited resources for homelessness on the weekends. [facility] is closed. Patient still verbalizing SI with plan., however was cleared yesterday. With recommendation for overnight observation here.  Will likely need to reconsult for TTS to re-evaluate patient and clear them for DC

## 2020-09-06 NOTE — Consult Note (Signed)
Telepsych Consultation   Reason for Consult:  complains of si/hi/avh. hx of same. hx borderline personality disorder. homeless Referring Physician:  Lorelee New PA-C Location of Patient:  [facility] Location of Provider: Behavioral Health TTS Department  Patient Identification: Jasmin Berry MRN:  [id number] Principal Diagnosis: Adjustment disorder Diagnosis:  Principal Problem:   Adjustment disorder Active Problems:   Suicidal ideation   Total Time spent with patient: 20 minutes  Subjective:   Jasmin Berry is a 55 y.o. female patient admitted with chronic suicidal ideations.  Patient initially presented resting in bed; Nursing staff reports via hospital chat that patient slept throughout the night; no aggressive or self harm behavior noted.   On assessment patient presented irritable and began stating she was "really messed up in the head and my friends at the sheriff department bought me here because I need help and I asked to call them yesterday but y'all want me to show out and that's what I'll do". Provider attempted to interject several times; patient became louder and louder over-talking provider. Provider attempted to explain inpatient criteria, outpatient services, and her current COVID status; patient began screaming and walking towards TTS [place] making threats. As a result TTS removed themselves from the room. Hospital police currently at door.   Patient continues to endorse suicidal ideations that appear chronic in nature and per chart review cannot be ruled out by secondary gain. Patient has not had any homicidal ideations, auditory or visual hallucinations, or any presenting psychosis since admission to the emergency department. Of note per chart review, patient was admitted to [facility] (07/22/20) where patient was noted to make several statements to staff and social worker noting intent of seeking inpatient hospitalization for secondary gain. Current presentation appears  to be behavioral and not psychiatric in nature. Case was reviewed with Attending MD Dr. Nelly Rout; patient is being psychiatrically cleared. TOC is currently following case to address psychosocial needs.   Per chart review:  07/28/20 1041 "CSW spoke with Tobi Bastos at [facility] who states that they have another bed at a shelter in [place] but the patient has told her that she will only accept [facility] at this time.  Tobi Bastos states that they will contact [facility] but they do not have anything else to offer her.  CSW spoke with Misty Stanley who had stated earlier this morning that she would go to the [facility] and speak with a case manager about house.  Jasmin Berry states now that she would like to leave at 27 with a friend and plans to go to [facility] to speak with them about housing options.  Jasmin Berry tells this CSW that "I could act out not and scream and yell and threaten to kill myself and yall would have to keep me but I am not going to do that".  Jasmin Berry also states that she could go to [facility] in [place] and hurt someone or herself so the hospital would have to take her back.  CSW has explained to [place] that the doctor believes that she is stable and that she cannot live at the hospital because it is short term and stabilization.  Mahi has spoken with CSW and the doctor and stated that she believes her medications are working well and that she does not feel suicidal "as long as you let me stay in the hospital but if I leave I will be suicidal again".  Jasmin Berry asked CSW about long term residential treatment for mental health that would house her indefinitely.  CSW explained that there are no options for that type of treatment.  Kamali states that she is angry at the doctor and CSW for the discharge but is willing to leave at this time."  HPI:   Jasmin Berry is a 55 year old female with a past history of borderline personality disorder, PTSD, MDD, and adjustment disorder who presented to [facility] with complaints of suicidal ideations. Per  chart reviewed patient was most recently discharged from [facility] 07/28/20 for similar presentation.  Past Psychiatric History:   -Borderline Personality Disorder  -Adjustment Disorder  -Major Depressive Disorder, recurrent severe without  psychotic features  Risk to Self:   Risk to Others:   Prior Inpatient Therapy:   Prior Outpatient Therapy:    Past Medical History:  Past Medical History:  Diagnosis Date   Hypertension    PTSD (post-traumatic stress disorder)    Renal disorder    Vertigo    History reviewed. No pertinent surgical history. Family History: History reviewed. No pertinent family history. Family Psychiatric  History: not noted Social History:  Social History   Substance and Sexual Activity  Alcohol Use No     Social History   Substance and Sexual Activity  Drug Use Yes   Comment: CBD    Social History   Socioeconomic History   Marital status: Single    Spouse name: Not on file   Number of children: Not on file   Years of education: Not on file   Highest education level: Not on file  Occupational History   Not on file  Tobacco Use   Smoking status: Every Day    Packs/day: 1.00    Pack years: 0.00    Types: Cigarettes   Smokeless tobacco: Never  Vaping Use   Vaping Use: Every day  Substance and Sexual Activity   Alcohol use: No   Drug use: Yes    Comment: CBD   Sexual activity: Not on file  Other Topics Concern   Not on file  Social History Narrative   Not on file   Social Determinants of Health   Financial Resource Strain: Not on file  Food Insecurity: Not on file  Transportation Needs: Not on file  Physical Activity: Not on file  Stress: Not on file  Social Connections: Not on file   Additional Social History:    Allergies:   Allergies  Allergen Reactions   Sulfa Antibiotics Rash   Yeast-Related Products Rash    Labs:  Results for orders placed or performed during the hospital encounter of 09/04/20 (from the past 48 hour(s))   Comprehensive metabolic panel     Status: Abnormal   Collection Time: 09/04/20  6:28 PM  Result Value Ref Range   Sodium 137 135 - 145 mmol/L   Potassium 4.3 3.5 - 5.1 mmol/L   Chloride 102 98 - 111 mmol/L   CO2 23 22 - 32 mmol/L   Glucose, Bld 128 (H) 70 - 99 mg/dL    Comment: Glucose reference range applies only to samples taken after fasting for at least 8 hours.   BUN 20 6 - 20 mg/dL   Creatinine, Ser 9.83 0.44 - 1.00 mg/dL   Calcium 8.9 8.9 - 38.2 mg/dL   Total Protein 7.9 6.5 - 8.1 g/dL   Albumin 4.0 3.5 - 5.0 g/dL   AST 24 15 - 41 U/L   ALT 16 0 - 44 U/L   Alkaline Phosphatase 96 38 - 126 U/L   Total Bilirubin 0.2 (  L) 0.3 - 1.2 mg/dL   GFR, Estimated >16>60 >10>60 mL/min    Comment: (NOTE) Calculated using the CKD-EPI Creatinine Equation (2021)    Anion gap 12 5 - 15    Comment: Performed at [facility], [address] [place], [place] [address]  Ethanol     Status: None   Collection Time: 09/04/20  6:28 PM  Result Value Ref Range   Alcohol, Ethyl (B) <10 <10 mg/dL    Comment: (NOTE) Lowest detectable limit for serum alcohol is 10 mg/dL.  For medical purposes only. Performed at [facility], [address] [place], [place] [address]   CBC with Diff     Status: None   Collection Time: 09/04/20  6:28 PM  Result Value Ref Range   WBC 5.9 4.0 - 10.5 K/uL   RBC 4.67 3.87 - 5.11 MIL/uL   Hemoglobin 14.1 12.0 - 15.0 g/dL   HCT 19.143.1 47.836.0 - 29.546.0 %   MCV 92.3 80.0 - 100.0 fL   MCH 30.2 26.0 - 34.0 pg   MCHC 32.7 30.0 - 36.0 g/dL   RDW 62.112.9 30.811.5 - 65.715.5 %   Platelets 219 150 - 400 K/uL   nRBC 0.0 0.0 - 0.2 %   Neutrophils Relative % 67 %   Neutro Abs 3.9 1.7 - 7.7 K/uL   Lymphocytes Relative 24 %   Lymphs Abs 1.4 0.7 - 4.0 K/uL   Monocytes Relative 8 %   Monocytes Absolute 0.4 0.1 - 1.0 K/uL   Eosinophils Relative 1 %   Eosinophils Absolute 0.1 0.0 - 0.5 K/uL   Basophils Relative 0 %   Basophils Absolute 0.0 0.0 - 0.1 K/uL    Immature Granulocytes 0 %   Abs Immature Granulocytes 0.01 0.00 - 0.07 K/uL    Comment: Performed at [facility], [address] [place], [place] [address]  Salicylate level     Status: Abnormal   Collection Time: 09/04/20  6:28 PM  Result Value Ref Range   Salicylate Lvl <7.0 (L) 7.0 - 30.0 mg/dL    Comment: Performed at [facility], [address] [place], [place] [address]  Acetaminophen level     Status: Abnormal   Collection Time: 09/04/20  6:28 PM  Result Value Ref Range   Acetaminophen (Tylenol), Serum <10 (L) 10 - 30 ug/mL    Comment: (NOTE) Therapeutic concentrations vary significantly. A range of 10-30 ug/mL  may be an effective concentration for many patients. However, some  are best treated at concentrations outside of this range. Acetaminophen concentrations >150 ug/mL at 4 hours after ingestion  and >50 ug/mL at 12 hours after ingestion are often associated with  toxic reactions.  Performed at [facility], [address] [place], [place] [address]   hCG, quantitative, pregnancy     Status: None   Collection Time: 09/04/20  6:28 PM  Result Value Ref Range   hCG, Beta Chain, Quant, S 3 <5 mIU/mL    Comment:          GEST. AGE      CONC.  (mIU/mL)   <=1 WEEK        5 - 50     2 WEEKS       50 - 500     3 WEEKS       100 - 10,000     4 WEEKS     1,000 - 30,000     5  WEEKS     3,500 - 115,000   6-8 WEEKS     12,000 - 270,000    12 WEEKS     15,000 - 220,000        FEMALE AND NON-PREGNANT FEMALE:     LESS THAN 5 mIU/mL Performed at [facility], [address], [place], [place] [address]   Resp Panel by RT-PCR (Flu A&B, Covid) Nasopharyngeal Swab     Status: Abnormal   Collection Time: 09/04/20  6:31 PM   Specimen: Nasopharyngeal Swab; Nasopharyngeal(NP) swabs in vial transport medium  Result Value Ref Range   SARS Coronavirus 2 by RT PCR POSITIVE (A) NEGATIVE    Comment: RESULT  CALLED TO, READ BACK BY AND VERIFIED WITH: HAILEY, RN @ 2226 ON 09/04/20 C VARNER (NOTE) SARS-CoV-2 target nucleic acids are DETECTED.  The SARS-CoV-2 RNA is generally detectable in upper respiratory specimens during the acute phase of infection. Positive results are indicative of the presence of the identified virus, but do not rule out bacterial infection or co-infection with other pathogens not detected by the test. Clinical correlation with patient history and other diagnostic information is necessary to determine patient infection status. The expected result is Negative.  Fact Sheet for Patients: BloggerCourse.com  Fact Sheet for Healthcare Providers: SeriousBroker.it  This test is not yet approved or cleared by the [place] FDA and  has been authorized for detection and/or diagnosis of SARS-CoV-2 by FDA under an Emergency Use Authorization (EUA).  This EUA will remain in effect (meaning this test ca n be used) for the duration of  the COVID-19 declaration under Section 564(b)(1) of the Act, 21 U.S.C. section 360bbb-3(b)(1), unless the authorization is terminated or revoked sooner.     Influenza A by PCR NEGATIVE NEGATIVE   Influenza B by PCR NEGATIVE NEGATIVE    Comment: (NOTE) The Xpert Xpress SARS-CoV-2/FLU/RSV plus assay is intended as an aid in the diagnosis of influenza from Nasopharyngeal swab specimens and should not be used as a sole basis for treatment. Nasal washings and aspirates are unacceptable for Xpert Xpress SARS-CoV-2/FLU/RSV testing.  Fact Sheet for Patients: BloggerCourse.com  Fact Sheet for Healthcare Providers: SeriousBroker.it  This test is not yet approved or cleared by the [place] FDA and has been authorized for detection and/or diagnosis of SARS-CoV-2 by FDA under an Emergency Use Authorization (EUA). This EUA will remain in effect  (meaning this test can be used) for the duration of the COVID-19 declaration under Section 564(b)(1) of the Act, 21 U.S.C. section 360bbb-3(b)(1), unless the authorization is terminated or revoked.  Performed at [facility], [address], [place], [place] [address]   Urine rapid drug screen (hosp performed)     Status: Abnormal   Collection Time: 09/04/20  7:37 PM  Result Value Ref Range   Opiates NONE DETECTED NONE DETECTED   Cocaine NONE DETECTED NONE DETECTED   Benzodiazepines NONE DETECTED NONE DETECTED   Amphetamines NONE DETECTED NONE DETECTED   Tetrahydrocannabinol POSITIVE (A) NONE DETECTED   Barbiturates NONE DETECTED NONE DETECTED    Comment: (NOTE) DRUG SCREEN FOR MEDICAL PURPOSES ONLY.  IF CONFIRMATION IS NEEDED FOR ANY PURPOSE, NOTIFY LAB WITHIN 5 DAYS.  LOWEST DETECTABLE LIMITS FOR URINE DRUG SCREEN Drug Class                     Cutoff (ng/mL) Amphetamine and metabolites    1000 Barbiturate and metabolites    200 Benzodiazepine  200 Tricyclics and metabolites     300 Opiates and metabolites        300 Cocaine and metabolites        300 THC                            50 Performed at [facility], [address] [place], [place] [address]     Medications:  Current Facility-Administered Medications  Medication Dose Route Frequency Provider Last Rate Last Admin   gabapentin (NEURONTIN) capsule 300 mg  300 mg Oral TID Leevy-Johnson, Maclane Holloran A, NP   300 mg at 09/05/20 2223   Current Outpatient Medications  Medication Sig Dispense Refill   atenolol (TENORMIN) 25 MG tablet Take 1 tablet (25 mg total) by mouth daily. 30 tablet 0   baclofen (LIORESAL) 10 MG tablet Take 20 mg by mouth 2 (two) times daily.     diphenhydrAMINE (BENADRYL) 25 MG tablet Take 100 mg by mouth every 6 (six) hours as needed.     escitalopram (LEXAPRO) 5 MG tablet Take 3 tablets (15 mg total) by mouth daily. 30 tablet 0    hydrocortisone cream 1 % Apply topically 4 (four) times daily as needed for itching. 30 g 0   Magnesium Oxide 500 MG CAPS Take 1 capsule by mouth daily.     Omega-3 Fatty Acids (FISH OIL) 1000 MG CAPS Take 1 capsule by mouth daily.     Turmeric (QC TUMERIC COMPLEX PO) Take 1 tablet by mouth daily.     amoxicillin-clavulanate (AUGMENTIN) 875-125 MG tablet Take 1 tablet by mouth every 12 (twelve) hours. (Patient not taking: Reported on 09/04/2020) 8 tablet 0   ARIPiprazole (ABILIFY) 2 MG tablet Take 1 tablet (2 mg total) by mouth daily. (Patient not taking: No sig reported) 30 tablet 0    Musculoskeletal: Strength & Muscle Tone: within normal limits Gait & Station: normal Patient leans: N/A  Psychiatric Specialty Exam:  Presentation  General Appearance: Casual  Eye Contact:Fair  Speech:Clear and Coherent  Speech Volume:Increased  Handedness:Right   Mood and Affect  Mood:Irritable; Labile  Affect:Blunt; Labile; Inappropriate   Thought Process  Thought Processes:Goal Directed  Descriptions of Associations:Intact  Orientation:Full (Time, Place and Person)  Thought Content:Logical  History of Schizophrenia/Schizoaffective disorder:No  Duration of Psychotic Symptoms:N/A  Hallucinations:Hallucinations: None  Ideas of Reference:None  Suicidal Thoughts:Suicidal Thoughts: Yes, Passive SI Passive Intent and/or Plan: Without Intent  Homicidal Thoughts:Homicidal Thoughts: No   Sensorium  Memory:Immediate Fair; Recent Fair; Remote Fair  Judgment:Intact  Insight:Present   Executive Functions  Concentration:Fair  Attention Span:Fair  Recall:Fair  Fund of Knowledge:Fair  Language:Fair   Psychomotor Activity  Psychomotor Activity:Psychomotor Activity: Increased   Assets  Assets:Resilience; Physical Health   Sleep  Sleep:Sleep: Good    Physical Exam: Physical Exam Vitals and nursing note reviewed.  Constitutional:      General: She is not in  acute distress.    Appearance: Normal appearance. She is normal weight. She is not ill-appearing or toxic-appearing.  HENT:     Head: Normocephalic.     Nose: Nose normal.  Pulmonary:     Effort: Pulmonary effort is normal.  Musculoskeletal:        General: Normal range of motion.     Cervical back: Normal range of motion.  Neurological:     General: No focal deficit present.     Mental Status: She is alert and oriented to person, place, and time.  Mental status is at baseline.  Psychiatric:        Attention and Perception: Attention and perception normal.        Mood and Affect: Affect is blunt and angry.        Speech: Speech normal.        Behavior: Behavior is agitated and aggressive.        Thought Content: Thought content normal.        Cognition and Memory: Cognition and memory normal.        Judgment: Judgment normal.   Review of Systems  Constitutional:  Negative for chills, diaphoresis, fever, malaise/fatigue and weight loss.  Psychiatric/Behavioral:  Negative for suicidal ideas.   All other systems reviewed and are negative. Blood pressure (!) 159/100, pulse 100, temperature 99.1 F (37.3 C), temperature source Oral, resp. rate 19, SpO2 98 %. There is no height or weight on file to calculate BMI.  Treatment Plan Summary: Plan Patient reviewed with Dr. Lucianne Muss and is being cleared by psychiatry. Hospital police currently at the bedside due to patient's behavior.   Disposition: No evidence of imminent risk to self or others at present.   Patient does not meet criteria for psychiatric inpatient admission. Supportive therapy provided about ongoing stressors. Discussed crisis plan, support from social network, calling 911, coming to the Emergency Department, and calling Suicide Hotline.  This service was provided via telemedicine using a 2-way, interactive audio and video technology.  Names of all persons participating in this telemedicine service and their role in this  encounter. Name: Maxie Barb Role: PMHNP  Name: Nelly Rout Role: Attending MD  Name: Raphael Gibney Role: patient   Name: Murrell Redden Role: TTS    Loletta Parish, NP 09/06/2020 12:48 PM

## 2020-09-06 NOTE — ED Triage Notes (Signed)
Per EMS, pt was just discharged from [facility] for SI/HI. Pt was psych cleared and discharged. She reports SI & HI.

## 2020-09-06 NOTE — ED Provider Notes (Addendum)
Emergency Medicine Provider Triage Evaluation Note  Jasmin Berry , a 55 y.o. female  was evaluated in triage.  Pt complains of SI/HI, just dc from the hospital, went to the bus stop and called 911 to come back, needs a place to stay until Monday.  Review of Systems  Positive: SI, HI, homeless  Negative: Abdominal pain  Physical Exam  There were no vitals taken for this visit. Gen:   Awake, no distress, tearful Resp:  Normal effort  MSK:   Moves extremities without difficulty  Other:  Speech clear  Medical Decision Making  Medically screening exam initiated at 3:42 PM.  Appropriate orders placed.  Ladene Allocca was informed that the remainder of the evaluation will be completed by another provider, this initial triage assessment does not replace that evaluation, and the importance of remaining in the ED until their evaluation is complete.  Discussed with CSW, COVID +, unable to place. Can stay with friend/family or choose to be homeless.      Jeannie Fend, PA-C 09/06/20 1543    Jeannie Fend, PA-C 09/06/20 1617    Cheryll Cockayne, MD 09/07/20 [phone]

## 2020-09-06 NOTE — Progress Notes (Signed)
CSW unable to place due to patient being covid positive. Patient unable to go to a shelter.

## 2020-09-07 ENCOUNTER — Encounter (HOSPITAL_COMMUNITY): Payer: Self-pay | Admitting: Emergency Medicine

## 2020-09-07 NOTE — ED Provider Notes (Signed)
[facility] Provider Note   CSN: [id number] Arrival date & time: 09/06/20  1541     History Chief Complaint  Patient presents with   Homeless    Jasmin Berry is a 55 y.o. female.  The history is provided by the patient.  Illness Quality:  Homeless Severity:  Moderate Onset quality:  Gradual Duration: months. Timing:  Constant Progression:  Unchanged Chronicity:  Chronic Context:  PTSD and has covid.  Has been cleared by psychiatry today and CSW has written a note that the patient cannot be placed. Relieved by:  Nothing Worsened by:  Nothing Ineffective treatments:  None tried Associated symptoms: no abdominal pain, no ear pain, no fever, no rash, no shortness of breath, no vomiting and no wheezing   Risk factors:  None During the MSE earlier in the day patient was cleared by psychiatry. Patient is currently not actively suicidal and denies SI and HI for me but states, her being homeless is not her problem and that is our problem.  She has not medical complaints at this time.      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 No pertinent surgical history.   OB History   No obstetric history on file.     History reviewed. No pertinent family history.  Social History   Tobacco Use   Smoking status: Every Day    Packs/day: 1.00    Pack years: 0.00    Types: Cigarettes   Smokeless tobacco: Never  Vaping Use   Vaping Use: Every day  Substance Use Topics   Alcohol use: No   Drug use: Yes    Comment: CBD    Home Medications Prior to Admission medications   Medication Sig Start Date End Date Taking? Authorizing  Provider  amoxicillin-clavulanate (AUGMENTIN) 875-125 MG tablet Take 1 tablet by mouth every 12 (twelve) hours. Patient not taking: Reported on 09/04/2020 07/28/20   Laveda Abbe, NP  ARIPiprazole (ABILIFY) 2 MG tablet Take 1 tablet (2 mg total) by mouth daily. Patient not taking: No sig reported 07/29/20   Laveda Abbe, NP  atenolol (TENORMIN) 25 MG tablet Take 1 tablet (25 mg total) by mouth daily. 07/29/20   Laveda Abbe, NP  baclofen (LIORESAL) 10 MG tablet Take 20 mg by mouth 2 (two) times daily.    [provider]  diphenhydrAMINE (BENADRYL) 25 MG tablet Take 100 mg by mouth every 6 (six) hours as needed.    [provider]  escitalopram (LEXAPRO) 5 MG tablet Take 3 tablets (15 mg total) by mouth daily. 07/29/20   Laveda Abbe, NP  hydrocortisone cream 1 % Apply topically 4 (four) times daily as needed for itching. 07/28/20   Laveda Abbe, NP  Magnesium Oxide 500 MG CAPS Take 1 capsule by mouth daily. 09/01/20   [provider]  Omega-3 Fatty Acids (FISH OIL) 1000 MG CAPS Take 1 capsule by mouth daily. 09/01/20   [provider]  Turmeric (QC TUMERIC COMPLEX PO) Take 1 tablet by mouth daily. 09/01/20   [provider]    Allergies    Sulfa antibiotics and Yeast-related products  Review of Systems   Review of Systems  Constitutional:  Negative for fever.  HENT:  Negative for ear pain.   Eyes:  Negative for redness.  Respiratory:  Negative for shortness of breath and wheezing.   Cardiovascular:  Negative for leg swelling.  Gastrointestinal:  Negative for abdominal pain and vomiting.  Genitourinary:  Negative for difficulty urinating.  Musculoskeletal:  Negative for neck stiffness.  Skin:  Negative for rash.  Neurological:  Negative for facial asymmetry.  Psychiatric/Behavioral:  Negative for self-injury and suicidal ideas.    Physical Exam Updated Vital Signs BP (!) 150/93 (BP Location: Left Arm)    Pulse (!) 108   Temp 98.8 F (37.1 C) (Oral)   Resp 18   Ht 4\' 10"  (1.473 m)   Wt 46.5 kg   SpO2 96%   BMI 21.41 kg/m   Physical Exam Vitals and nursing note reviewed.  Constitutional:      General: She is not in acute distress.    Appearance: Normal appearance.  HENT:     Head: Normocephalic and atraumatic.     Nose: Nose normal.  Eyes:     Conjunctiva/sclera: Conjunctivae normal.     Pupils: Pupils are equal, round, and reactive to light.  Cardiovascular:     Rate and Rhythm: Normal rate and regular rhythm.     Pulses: Normal pulses.     Heart sounds: Normal heart sounds.  Pulmonary:     Effort: Pulmonary effort is normal.     Breath sounds: Normal breath sounds.  Abdominal:     General: Abdomen is flat. Bowel sounds are normal.     Palpations: Abdomen is soft.     Tenderness: There is no abdominal tenderness. There is no guarding.  Musculoskeletal:        General: Normal range of motion.     Cervical back: Normal range of motion and neck supple.  Skin:    General: Skin is warm and dry.     Capillary Refill: Capillary refill takes less than 2 seconds.  Neurological:     General: No focal deficit present.     Mental Status: She is alert and oriented to person, place, and time.     Deep Tendon Reflexes: Reflexes normal.  Psychiatric:        Thought Content: Thought content normal.     Comments: Rude and demanding.  No SI or HI     ED Results / Procedures / Treatments   Labs (all labs ordered are listed, but only abnormal results are displayed) Labs Reviewed - No data to display  EKG None  Radiology No results found.  Procedures Procedures   Medications Ordered in ED Medications - No data to display  ED Course  I have reviewed the triage vital signs and the nursing notes.  Pertinent labs & imaging results that were available during my care of the patient were reviewed by me and considered in my medical decision making (see chart for  details).   Patient is rude and demanding.  She has not acute medical emergency at this time and is psychiatrically stable.  Normal vitals and is not symptomatic from covid.  She is stable for discharge with close follow up.     Jasmin Berry was evaluated in Emergency Department on 09/07/2020 for the symptoms described in the history of present illness. She was evaluated in the context of the global COVID-19 pandemic, which necessitated consideration that the patient might be at risk for infection with  the SARS-CoV-2 virus that causes COVID-19. Institutional protocols and algorithms that pertain to the evaluation of patients at risk for COVID-19 are in a state of rapid change based on information released by regulatory bodies including the CDC and federal and state organizations. These policies and algorithms were followed during the patient's care in the ED.  Final Clinical Impression(s) / ED Diagnoses Final diagnoses:  Homeless  COVID-19   Return for intractable cough, coughing up blood, fevers > 100.4 unrelieved by medication, shortness of breath, intractable vomiting, chest pain, shortness of breath, weakness, numbness, changes in speech, facial asymmetry, abdominal pain, passing out, Inability to tolerate liquids or food, cough, altered mental status or any concerns. No signs of systemic illness or infection. The patient is nontoxic-appearing on exam and vital signs are within normal limits. I have reviewed the triage vital signs and the nursing notes. Pertinent labs & imaging results that were available during my care of the patient were reviewed by me and considered in my medical decision making (see chart for details). After history, exam, and medical workup I feel the patient has been appropriately medically screened and is safe for discharge home. Pertinent diagnoses were discussed with the patient. Patient was given return precautions.     Keyton Bhat, MD 09/07/20 [phone]

## 2021-08-15 IMAGING — CT CT ABD-PELV W/ CM
2 of 5 series · 16 of 46 positions shown, 18 images · IV contrast (omnipaque)
Comparison: December 21, 2009

CLINICAL DATA: Body aches

EXAM:
CT ABDOMEN AND PELVIS WITH CONTRAST
TECHNIQUE: Multidetector CT imaging of the abdomen and pelvis was performed
using the standard protocol following bolus administration of
intravenous contrast.
CONTRAST:  100mL OMNIPAQUE IOHEXOL 300 MG/ML  SOLN

[Series 2: axial st · axial · 0.68mm/px · z∈[+1034,+1368]mm · 13 of 79 slices shown, 15 images]
[im 6/79  soft-tissue]
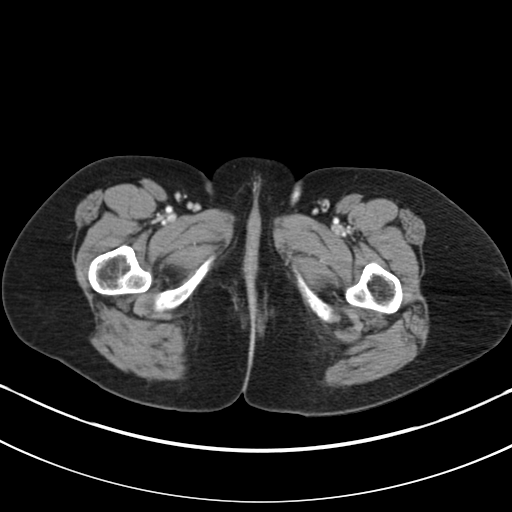
[im 6/79  bone]
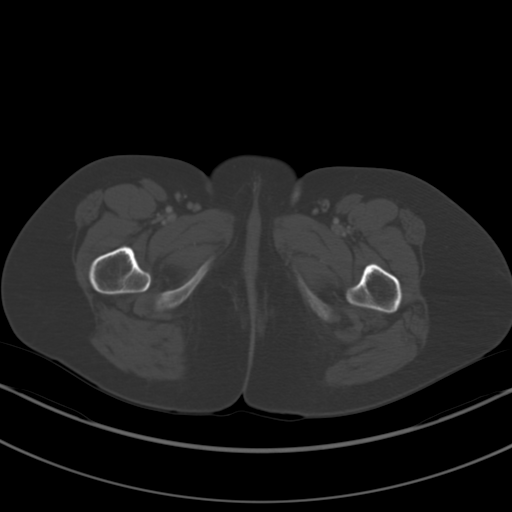
[im 12/79  soft-tissue]
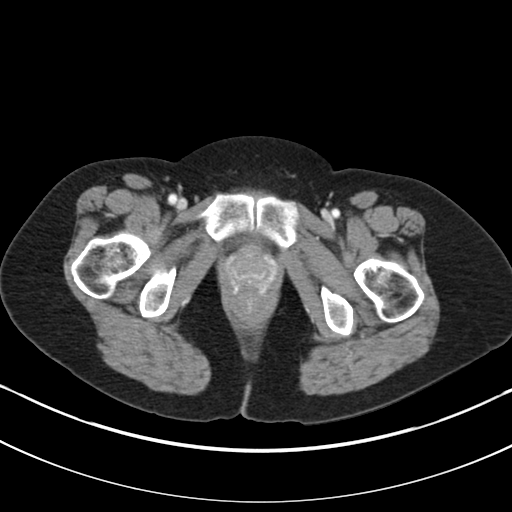
[im 17/79  soft-tissue]
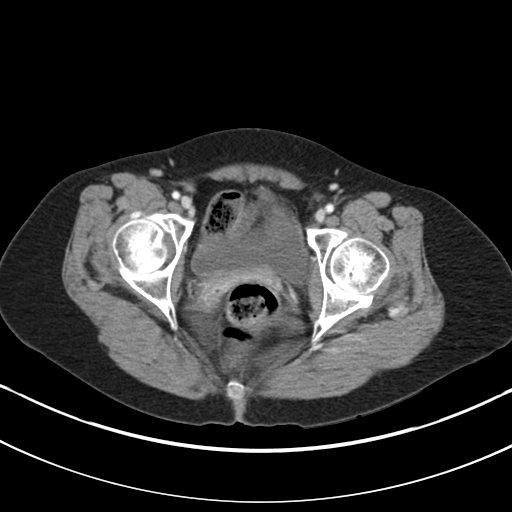
[im 23/79  soft-tissue]
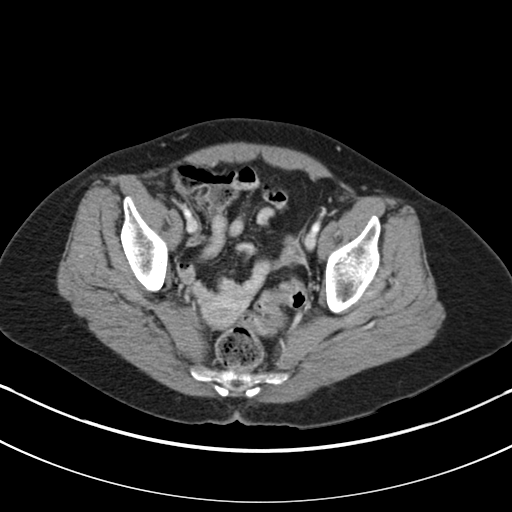
[im 28/79  soft-tissue]
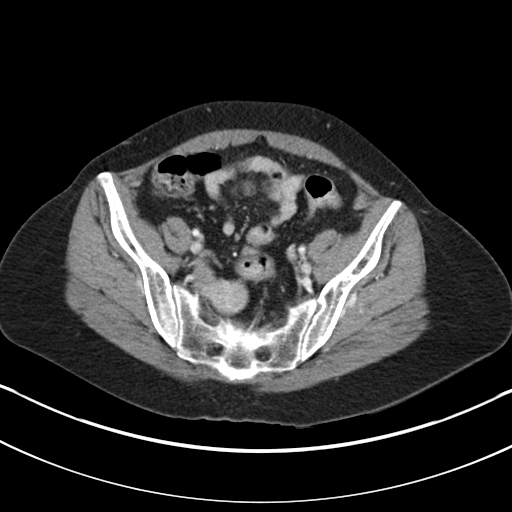
[im 34/79  soft-tissue]
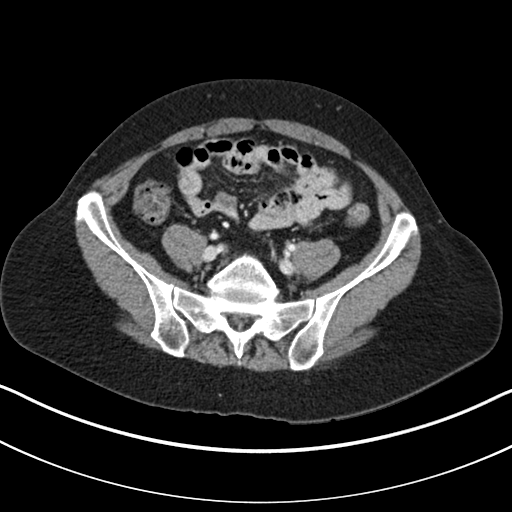
[im 40/79  soft-tissue]
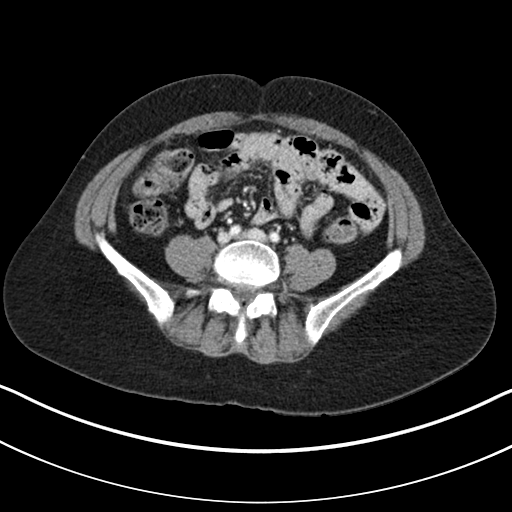
[im 45/79  soft-tissue]
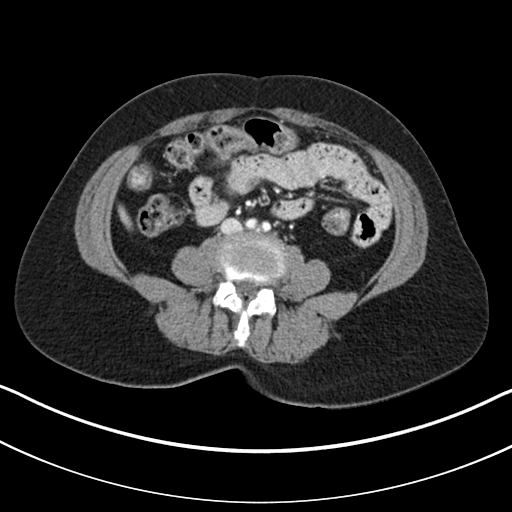
[im 51/79  soft-tissue]
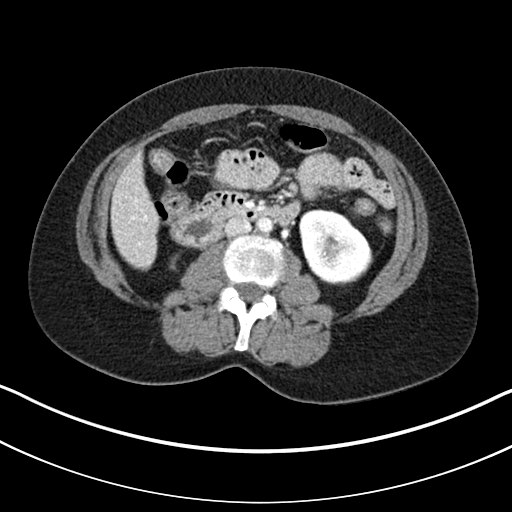
[im 51/79  bone]
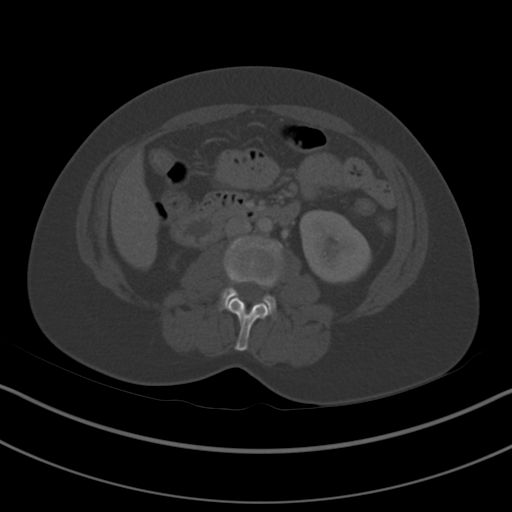
[im 56/79  soft-tissue]
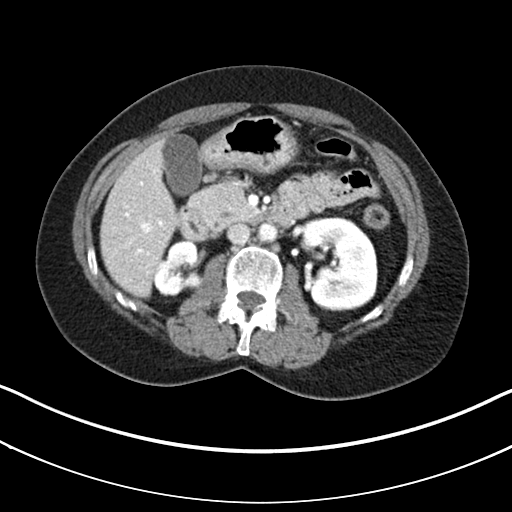
[im 62/79  soft-tissue]
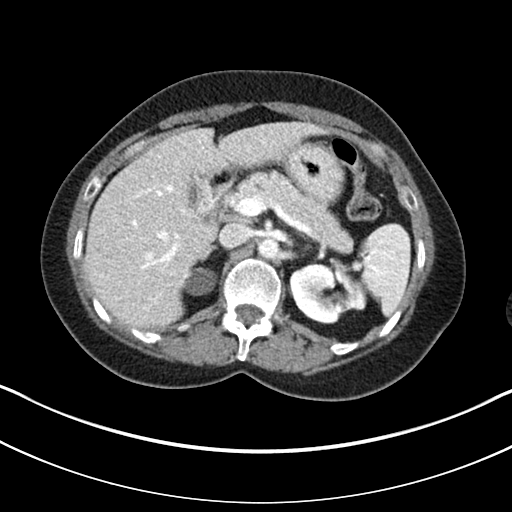
[im 67/79  soft-tissue]
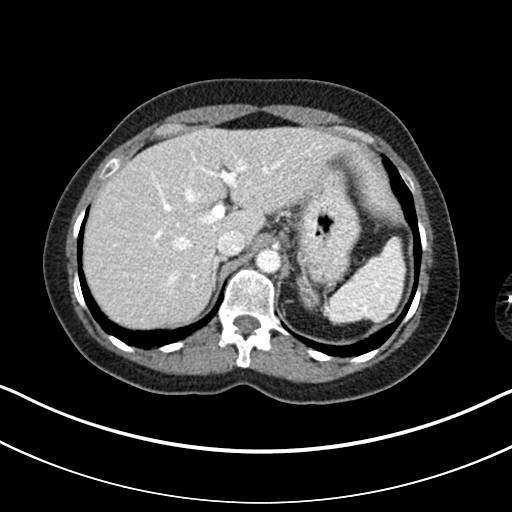
[im 73/79  soft-tissue]
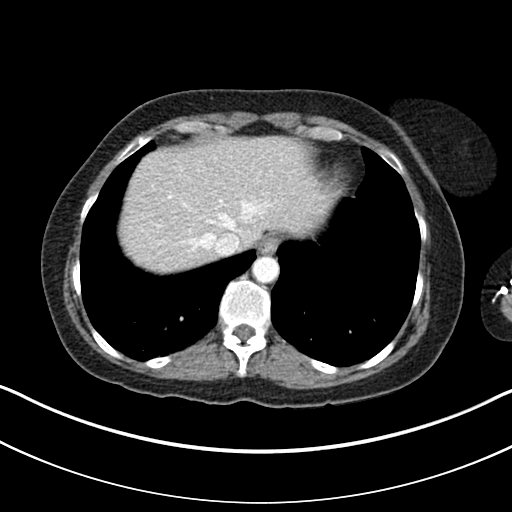

[Series 5: coronal st · coronal · 0.75mm/px · 3 of 129 slices shown]
[im 43/129  soft-tissue]
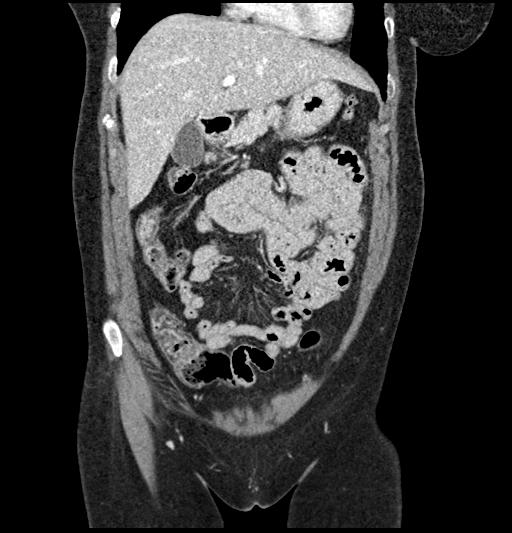
[im 57/129  soft-tissue]
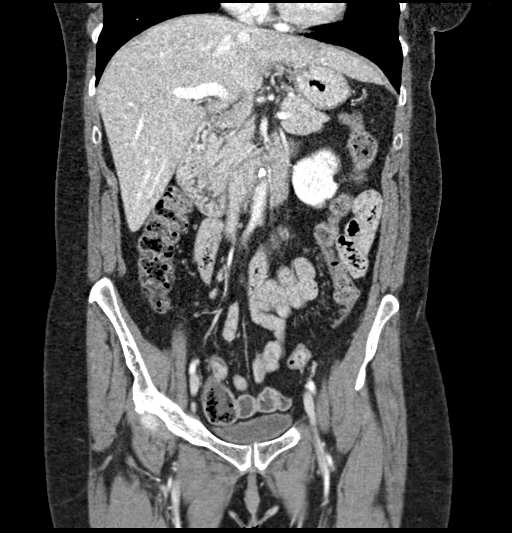
[im 72/129  soft-tissue]
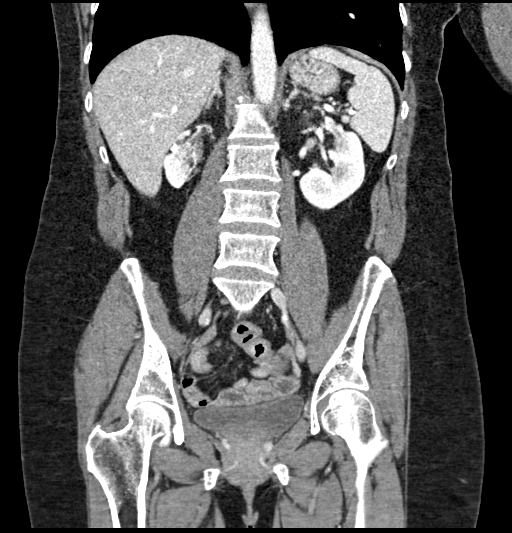

[16 of 46 positions shown; findings below may reference images not displayed]

FINDINGS: Lower chest: Scattered bibasilar atelectasis.

Hepatobiliary: Portal and hepatic veins are patent.No suspicious
focal lesion. Gallbladder is unremarkable. No intrahepatic or
extrahepatic biliary ductal dilation.

Pancreas: Unremarkable. No pancreatic ductal dilatation or
surrounding inflammatory changes.

Spleen: Normal in size without focal abnormality.

Adrenals/Urinary Tract: Adrenal glands are unremarkable.
Revisualization of RIGHT greater than LEFT renal cortical scarring.
Cyst of the superior pole of the RIGHT kidney. No hydronephrosis.
Bladder is unremarkable.

Stomach/Bowel: No evidence of bowel obstruction. Appendix is normal.
No evidence of diffuse or focal bowel wall thickening.

Vascular/Lymphatic: Circumaortic LEFT renal vein. Atherosclerotic
calcifications of the aorta, mild. Aorta is normal in course and
caliber. No suspicious lymphadenopathy.

Reproductive: Uterus and bilateral adnexa are unremarkable.

Other: No free air or free fluid.

Musculoskeletal: No acute or significant osseous findings.
IMPRESSION: 1. No CT etiology for acute abdominal pain identified.
2. Revisualization of RIGHT greater than LEFT renal cortical
scarring.

Aortic Atherosclerosis ([id number]).
